# Patient Record
Sex: Male | Born: 1970
Health system: Southern US, Community
[De-identification: ages and names within clinical notes are randomized; demographics above are authoritative.]

## PROBLEM LIST (undated history)

## (undated) DIAGNOSIS — E7800 Pure hypercholesterolemia, unspecified: Secondary | ICD-10-CM

## (undated) DIAGNOSIS — J309 Allergic rhinitis, unspecified: Secondary | ICD-10-CM

## (undated) DIAGNOSIS — F4310 Post-traumatic stress disorder, unspecified: Secondary | ICD-10-CM

## (undated) DIAGNOSIS — G4733 Obstructive sleep apnea (adult) (pediatric): Secondary | ICD-10-CM

## (undated) DIAGNOSIS — F329 Major depressive disorder, single episode, unspecified: Secondary | ICD-10-CM

## (undated) HISTORY — DX: Obstructive sleep apnea (adult) (pediatric): G47.33

## (undated) HISTORY — DX: Pure hypercholesterolemia, unspecified: E78.00

## (undated) HISTORY — DX: Major depressive disorder, single episode, unspecified: F32.9

## (undated) HISTORY — DX: Allergic rhinitis, unspecified: J30.9

## (undated) HISTORY — DX: Post-traumatic stress disorder, unspecified: F43.10

---

## 2014-02-28 ENCOUNTER — Ambulatory Visit (INDEPENDENT_AMBULATORY_CARE_PROVIDER_SITE_OTHER): Payer: Managed Care, Other (non HMO) | Admitting: Emergency Medicine

## 2014-02-28 VITALS — BP 110/70 | HR 60 | Temp 99.2°F | Resp 16 | Ht 67.25 in | Wt 177.0 lb

## 2014-02-28 DIAGNOSIS — E785 Hyperlipidemia, unspecified: Secondary | ICD-10-CM

## 2014-02-28 DIAGNOSIS — Z0000 Encounter for general adult medical examination without abnormal findings: Secondary | ICD-10-CM

## 2014-02-28 DIAGNOSIS — Z23 Encounter for immunization: Secondary | ICD-10-CM

## 2014-02-28 DIAGNOSIS — E7800 Pure hypercholesterolemia, unspecified: Secondary | ICD-10-CM

## 2014-02-28 DIAGNOSIS — Z1159 Encounter for screening for other viral diseases: Secondary | ICD-10-CM

## 2014-02-28 DIAGNOSIS — Z113 Encounter for screening for infections with a predominantly sexual mode of transmission: Secondary | ICD-10-CM

## 2014-02-28 HISTORY — DX: Hyperlipidemia, unspecified: E78.5

## 2014-02-28 LAB — POCT CBC
Granulocyte percent: 46.1 %G (ref 37–80)
HCT, POC: 45.8 % (ref 43.5–53.7)
Hemoglobin: 15.1 g/dL (ref 14.1–18.1)
Lymph, poc: 2.6 (ref 0.6–3.4)
MCH: 29.8 pg (ref 27–31.2)
MCHC: 33 g/dL (ref 31.8–35.4)
MCV: 90.3 fL (ref 80–97)
MID (CBC): 0.2 (ref 0–0.9)
MPV: 7.3 fL (ref 0–99.8)
POC GRANULOCYTE: 2.4 (ref 2–6.9)
POC LYMPH %: 50.4 % — AB (ref 10–50)
POC MID %: 3.5 %M (ref 0–12)
Platelet Count, POC: 224 10*3/uL (ref 142–424)
RBC: 5.07 M/uL (ref 4.69–6.13)
RDW, POC: 12.5 %
WBC: 5.1 10*3/uL (ref 4.6–10.2)

## 2014-02-28 LAB — POCT URINALYSIS DIPSTICK
BILIRUBIN UA: NEGATIVE
Glucose, UA: NEGATIVE
KETONES UA: NEGATIVE
LEUKOCYTES UA: NEGATIVE
Nitrite, UA: NEGATIVE
Protein, UA: NEGATIVE
RBC UA: NEGATIVE
SPEC GRAV UA: 1.02
UROBILINOGEN UA: 0.2
pH, UA: 5.5

## 2014-02-28 LAB — LIPID PANEL
CHOL/HDL RATIO: 7.3 ratio
Cholesterol: 307 mg/dL — ABNORMAL HIGH (ref 0–200)
HDL: 42 mg/dL (ref >39)
LDL CALC: 250 mg/dL — AB (ref 0–99)
Triglycerides: 74 mg/dL (ref <150)
VLDL: 15 mg/dL (ref 0–40)

## 2014-02-28 LAB — HIV ANTIBODY (ROUTINE TESTING W REFLEX): HIV: NONREACTIVE

## 2014-02-28 LAB — HEPATITIS C ANTIBODY: HCV Ab: NEGATIVE

## 2014-02-28 LAB — POCT UA - MICROSCOPIC ONLY
Bacteria, U Microscopic: NEGATIVE
Casts, Ur, LPF, POC: NEGATIVE
Crystals, Ur, HPF, POC: NEGATIVE
Epithelial cells, urine per micros: 0.3
MUCUS UA: NEGATIVE
RBC, URINE, MICROSCOPIC: NEGATIVE
WBC, Ur, HPF, POC: NEGATIVE
Yeast, UA: NEGATIVE

## 2014-02-28 LAB — COMPLETE METABOLIC PANEL WITH GFR
ALT: 22 U/L (ref 0–53)
AST: 20 U/L (ref 0–37)
Albumin: 4.1 g/dL (ref 3.5–5.2)
Alkaline Phosphatase: 44 U/L (ref 39–117)
BUN: 16 mg/dL (ref 6–23)
CALCIUM: 9.5 mg/dL (ref 8.4–10.5)
CHLORIDE: 104 meq/L (ref 96–112)
CO2: 29 mEq/L (ref 19–32)
Creat: 1.1 mg/dL (ref 0.50–1.35)
GFR, Est African American: >89 mL/min
GFR, Est Non African American: 81 mL/min
Glucose, Bld: 78 mg/dL (ref 70–99)
Potassium: 4.1 mEq/L (ref 3.5–5.3)
Sodium: 139 mEq/L (ref 135–145)
Total Bilirubin: 0.8 mg/dL (ref 0.2–1.2)
Total Protein: 7.1 g/dL (ref 6.0–8.3)

## 2014-02-28 MED ORDER — ATORVASTATIN CALCIUM 10 MG PO TABS: 10.0000 mg | ORAL_TABLET | Freq: Every day | ORAL | Status: DC

## 2014-02-28 NOTE — Progress Notes (Signed)
This chart was scribed for Spencer Jordan, MD by Einar Pheasant, ED Scribe. This patient was seen in room 9 and the patient's care was started at 11:26 AM.  Subjective:    Patient ID: Spencer Byrd Plan, male    DOB: 25-Jul-1970, 44 y.o.   MRN: [id number]  Chief Complaint  Patient presents with  . Annual Exam    HPI Spencer Byrd is a 44 y.o. male with a PMhx of seasonal allergies and high cholesterol.  Today, pt presents to the office for his annual Exam. Pt works at [employer]. Denies getting a flu shot this season. He also denies ever getting a tetanus vaccine in the last 10 years. Pt states that he exercises regularly by lifting weights. He denies any illicit drug use, tobacco use, and alcohol use. Pt was incarcerated for 8 years.   No pertinent family hx.  Pt reports eating a bowl of cereal this morning at 6:30 AM.  There are no active problems to display for this patient.  Past Medical History  Diagnosis Date  . High cholesterol    History reviewed. No pertinent past surgical history. No Known Allergies Prior to Admission medications   Medication Sig Start Date End Date Taking? Authorizing Provider  atorvastatin (LIPITOR) 10 MG tablet Take 10 mg by mouth daily.   Yes Historical Provider, MD   History   Social History  . Marital Status: Married    Spouse Name: N/A    Number of Children: N/A  . Years of Education: N/A   Occupational History  . Not on file.   Social History Main Topics  . Smoking status: Never Smoker   . Smokeless tobacco: Not on file  . Alcohol Use: Not on file  . Drug Use: Not on file  . Sexual Activity: Not on file   Other Topics Concern  . Not on file   Social History Narrative  . No narrative on file    Review of Systems  Constitutional: Negative for fatigue and unexpected weight change.  Eyes: Negative for visual disturbance.  Respiratory: Negative for cough, chest tightness and shortness of breath.     Cardiovascular: Negative for chest pain, palpitations and leg swelling.  Gastrointestinal: Negative for abdominal pain and blood in stool.  Neurological: Negative for dizziness, light-headedness and headaches.       Objective:   Physical Exam  Constitutional: He appears well-developed and well-nourished. No distress.  HENT:  Head: Normocephalic and atraumatic.  Eyes: Conjunctivae are normal. Right eye exhibits no discharge. Left eye exhibits no discharge.  Neck: Neck supple.  Cardiovascular: Normal rate, regular rhythm and normal heart sounds.  Exam reveals no gallop and no friction rub.   No murmur heard. Pulmonary/Chest: Effort normal and breath sounds normal. No respiratory distress.  Abdominal: Soft. He exhibits no distension. There is no tenderness.  Genitourinary: Rectum normal, prostate normal and penis normal. Guaiac negative stool. No penile tenderness.  Musculoskeletal: He exhibits no edema or tenderness.  Neurological: He is alert.  Skin: Skin is warm and dry.  Psychiatric: He has a normal mood and affect. His behavior is normal. Thought content normal.  Nursing note and vitals reviewed.    Filed Vitals:   02/28/14 1118  BP: 110/70  Pulse: 60  Temp: 99.2 F (37.3 C)  TempSrc: Oral  Resp: 16  Height: 5' 7.25" (1.708 m)  Weight: 177 lb (80.287 kg)  SpO2: 98%   Results for orders placed or performed in visit on  02/28/14  POCT CBC  Result Value Ref Range   WBC 5.1 4.6 - 10.2 K/uL   Lymph, poc 2.6 0.6 - 3.4   POC LYMPH PERCENT 50.4 (A) 10 - 50 %L   MID (cbc) 0.2 0 - 0.9   POC MID % 3.5 0 - 12 %M   POC Granulocyte 2.4 2 - 6.9   Granulocyte percent 46.1 37 - 80 %G   RBC 5.07 4.69 - 6.13 M/uL   Hemoglobin 15.1 14.1 - 18.1 g/dL   HCT, POC 45.8 43.5 - 53.7 %   MCV 90.3 80 - 97 fL   MCH, POC 29.8 27 - 31.2 pg   MCHC 33.0 31.8 - 35.4 g/dL   RDW, POC 12.5 %   Platelet Count, POC 224 142 - 424 K/uL   MPV 7.3 0 - 99.8 fL  POCT UA - Microscopic Only  Result  Value Ref Range   WBC, Ur, HPF, POC Neg    RBC, urine, microscopic Neg    Bacteria, U Microscopic Neg'    Mucus, UA Neg    Epithelial cells, urine per micros 0.3    Crystals, Ur, HPF, POC Neg    Casts, Ur, LPF, POC Neg    Yeast, UA neg   POCT urinalysis dipstick  Result Value Ref Range   Color, UA Yellow    Clarity, UA Clear    Glucose, UA Neg    Bilirubin, UA Neg    Ketones, UA Neg    Spec Grav, UA 1.020    Blood, UA Neg    pH, UA 5.5    Protein, UA Neg    Urobilinogen, UA 0.2    Nitrite, UA Neg    Leukocytes, UA Negative         Assessment & Plan:   Encouraged him to continue weight loss and exercise. His Lipitor was refilled. Routine labs were done. He was given a flu shot and DTaP today. I personally performed the services described in this documentation, which was scribed in my presence. The recorded information has been reviewed and is accurate.

## 2014-03-01 ENCOUNTER — Other Ambulatory Visit: Payer: Self-pay | Admitting: Radiology

## 2014-03-01 LAB — RPR

## 2014-03-01 MED ORDER — ATORVASTATIN CALCIUM 40 MG PO TABS: 40.0000 mg | ORAL_TABLET | Freq: Every day | ORAL | Status: DC

## 2014-03-02 ENCOUNTER — Telehealth: Payer: Self-pay | Admitting: *Deleted

## 2014-03-02 LAB — GC/CHLAMYDIA PROBE AMP
CT PROBE, AMP APTIMA: NEGATIVE
GC Probe RNA: NEGATIVE

## 2014-03-02 NOTE — Telephone Encounter (Signed)
Dr Everlene Farrier- please be advised that the patient came to the office today stating that he is concerned about increasing the lipitor to 40mg . He states that he has not been taking the lipitor for the past few months at least as he did not have any. He also was not fasting at his visit when the blood work was completed. He picked up the supply of the Lipitor 10mg  at the pharmacy and has restarted taking this.  Pt needs to know if he should still increase to 40mg . Please call [phone]

## 2014-03-03 ENCOUNTER — Other Ambulatory Visit: Payer: Self-pay

## 2014-03-03 MED ORDER — ATORVASTATIN CALCIUM 40 MG PO TABS: 40.0000 mg | ORAL_TABLET | Freq: Every day | ORAL | Status: DC

## 2014-03-03 NOTE — Telephone Encounter (Signed)
Spoke to pt- he understands to take medication daily and will pick up the 40mg . Resent script to pharmacy as they did not have it. Only received the 10mg  rx.

## 2014-03-03 NOTE — Telephone Encounter (Signed)
His cholesterol level was over 300. There is no way 10 mg of Lipitor will control this. He needs to take the 40 mg dose

## 2014-03-07 ENCOUNTER — Telehealth: Payer: Self-pay

## 2014-03-07 NOTE — Telephone Encounter (Signed)
The patient called about a medication reaction to Atorvastatin.  He said that the increased dosage makes him itch all over his body.  He said that he used to take a smaller dosage, but at his last visit, his dosage was increased.  He said he did not have a reaction when he took the lesser dosage.  Please advise.  Thank you.  CB#: [phone]

## 2014-03-07 NOTE — Telephone Encounter (Signed)
Can Lipitor cause hives?

## 2014-03-08 NOTE — Telephone Encounter (Signed)
I would have the patient stop the medication and RTC for [place] to evaluate the rash.  It is definitely possible that he is having an allergic reaction to the higher dose but I would not him on the lower dose of that medication if he is truly having  Reaction.

## 2014-03-09 NOTE — Telephone Encounter (Signed)
Pt has continued taking medication. He is no longer having any kind of reaction. He states his wife bought a new body wash for him and this was the cause. He has stopped using the soap.   Advised pt to call us with any concerns or RTC

## 2014-04-14 ENCOUNTER — Telehealth: Payer: Self-pay

## 2014-04-14 NOTE — Telephone Encounter (Signed)
DAUB - Pt saw you in  January for his CPE.  He now needs a physical form filled out for his work.  I have left this in the nurses box.  Please fax to his employer at [phone] once completed and call him at [phone] to let him know its been done.

## 2014-04-15 NOTE — Telephone Encounter (Signed)
Dr. Everlene Farrier, form filled out and placed in your box.

## 2014-04-23 ENCOUNTER — Telehealth: Payer: Self-pay

## 2014-04-23 NOTE — Telephone Encounter (Signed)
I believe we faxed this in.

## 2014-04-23 NOTE — Telephone Encounter (Signed)
Patient has not heard anything from [place] about his form he left on February 23.  See previous phone message.  The form was filled out and left in Daub's box.  I looked for it and did not see it.  Please call [phone]

## 2014-11-21 ENCOUNTER — Ambulatory Visit (INDEPENDENT_AMBULATORY_CARE_PROVIDER_SITE_OTHER): Payer: Managed Care, Other (non HMO) | Admitting: Physician Assistant

## 2014-11-21 VITALS — BP 114/80 | HR 55 | Temp 97.7°F | Resp 18 | Ht 67.5 in | Wt 171.0 lb

## 2014-11-21 DIAGNOSIS — E785 Hyperlipidemia, unspecified: Secondary | ICD-10-CM | POA: Diagnosis not present

## 2014-11-21 DIAGNOSIS — Z7189 Other specified counseling: Secondary | ICD-10-CM | POA: Diagnosis not present

## 2014-11-21 NOTE — Addendum Note (Signed)
Addended by: Tereasa Coop on: 11/21/2014 09:52 AM   Modules accepted: Orders, Medications, SmartSet

## 2014-11-21 NOTE — Progress Notes (Signed)
   11/21/2014 at 9:47 AM  [name] Plan / DOB: 08-May-1970 / MRN: [id number]  The patient has Hyperlipidemia on his problem list.  SUBJECTIVE  Spencer Byrd is a 44 y.o. well appearing male presenting for the chief complaint of cholesterol check. He was placed on cholesterol medicine last year and would like his level checked today.    He denies a family history of heart disease. He has no history of HTN, DM2, and does not smoke. He is very physically active and tries to avoid sweets and greasy foods.      He  has a past medical history of High cholesterol.    Medications reviewed and updated by myself where necessary, and exist elsewhere in the encounter.   Mr. Spencer Byrd has No Known Allergies. He  reports that he has never smoked. He does not have any smokeless tobacco history on file. He  has no sexual activity history on file. The patient  has no past surgical history on file.  His family history is not on file.  Review of Systems  Constitutional: Negative.   Genitourinary: Negative.     OBJECTIVE  His  height is 5' 7.5" (1.715 m) and weight is 171 lb (77.565 kg). His oral temperature is 97.7 F (36.5 C). His blood pressure is 114/80 and his pulse is 55. His respiration is 18 and oxygen saturation is 99%.  The patient's body mass index is 26.37 kg/(m^2).  Physical Exam  Constitutional: He is oriented to person, place, and time.  Cardiovascular: Normal rate.   Respiratory: Effort normal.  Neurological: He is alert and oriented to person, place, and time. No cranial nerve deficit.  Skin: Skin is warm and dry.  Psychiatric: He has a normal mood and affect.    Lab Results  Component Value Date   CHOL 307* 02/28/2014   Lab Results  Component Value Date   HDL 42 02/28/2014   Lab Results  Component Value Date   LDLCALC 250* 02/28/2014   Lab Results  Component Value Date   TRIG 74 02/28/2014   Lab Results  Component Value Date   CHOLHDL 7.3 02/28/2014   No  results found for: LDLDIRECT   No results found for this or any previous visit (from the past 24 hour(s)).  ASSESSMENT & PLAN  Crist was seen today for hyperlipidemia.  Diagnoses and all orders for this visit:  Encounter for medication counseling: ASCVD risk calculated using pre cholesterol medication lipid panel.  10 year global risk at 4%. Given the lack of other risk factors he will not benefit from a statin medication and may suffer side effects.  He is to return to clinic for an annual physical.    The patient was advised to call or come back to clinic if he does not see an improvement in symptoms, or worsens with the above plan.   Philis Fendt, MHS, PA-C [facility] 11/21/2014 9:47 AM

## 2016-11-20 ENCOUNTER — Encounter: Payer: Self-pay | Admitting: Psychology

## 2017-04-03 ENCOUNTER — Encounter: Payer: Self-pay | Admitting: Psychology

## 2017-04-03 ENCOUNTER — Ambulatory Visit: Payer: 59 | Admitting: Psychology

## 2017-04-03 DIAGNOSIS — R413 Other amnesia: Secondary | ICD-10-CM | POA: Diagnosis not present

## 2017-04-03 NOTE — Progress Notes (Signed)
NEUROBEHAVIORAL STATUS EXAM   Name: Spencer Byrd Date of Birth: [birth year] Date of Interview: 04/03/2017  Reason for Referral:  Spencer Byrd is a 47 y.o. male who is referred for neuropsychological evaluation by Cleda Mccre[facility] of [facility] due to concerns about memory decline. This patient is unaccompanied in the office for today's visit.  History of Presenting Problem:  Spencer Byrd reports memory difficulties over at least the past year or so, gradual in onset. It is concerning to him and particularly concerning to his wife, who wants to get him evaluated to make sure he is not developing dementia. He works full time, third shift, at [employer]. He does not have any difficulties performing work responsibilities, but he notes the work is not too cognitively demanding. He does have to remember numbers and he doesn't have trouble with that. MMSE was 28/30 at last appointment with Spencer Byrd in 10/2016.  Upon direct questioning, the patient reported:   Forgetting recent conversations/events: yes, but cues help Repeating statements/questions: yes (wife says he does) Misplacing/losing items: sometimes, but finds eventually Forgetting appointments or other obligations: could forget if he doesn't have a reminder/alert Forgetting to take medications: could occur if doesn't have a reminder/alert  Difficulty concentrating: yes Starting but not finishing tasks: yes  Distracted easily: yes  Word-finding difficulty: yes Comprehension difficulty: "only if people use big words"  Getting lost when driving: No Uncertain about directions when driving or passenger: Somewhat, but he has only been here for 3 years (moved here from [place])  There is no known family history of dementia.  He denies any prior psychiatric history. He reports he is typically a happy person, quiet and respectful. He reports significant stress lately due to his mother's illness -  she has cancer and is currently in the hospital. He lost his stepfather to cancer a year ago. He is very close with his mother.  He denies any sleep difficulty. He falls asleep very easily and without meaning to when he is home with the family. He figured this is just due to the large amount of physical labor he engages in daily at work. Per medical records, he had a sleep study in 2017 which showed only mild OSA not requiring CPAP treatment. Epworth scale was 19 at his last PCP appointment in 10/2016.  He reports he used to work out an hour a day but he has been "getting lazy" the past several months. He is trying to get back into it. He reports he got bored with it. He has a lifelong history of getting bored with things quickly.   Appetite is good. His wife tells him he is eating more.   Social History: Born/Raised: [place] Education: Didn't care much about school. Feels like he just got passed along in school. He left school in high school. He is taking a GED class, he is somewhat ashamed of not finishing high school. Occupational history: currently employed at [employer] Marital history: Married x17 years, 3 children (youngest daughter lives here), 1 granddaughter  Alcohol: Minimal, once in a while Tobacco: Never a smoker SA: None   Medical History: Past Medical History:  Diagnosis Date  . High cholesterol       Current Medications:  Flonase 50 mcg 1 spray in each nostril daily Atorvastatin Calcium 20 mg 1 tablet daily   Behavioral Observations:   Appearance: Neatly and appropriately dressed and groomed Gait: Ambulated independently, no gross abnormalities observed  Speech: Fluent; normal rate, rhythm and volume. No significant word finding difficulty. Thought process: Linear, goal directed Affect: Full, euthymic Interpersonal: Very pleasant, appropriate   40 minutes spent face-to-face with patient completing neurobehavioral status exam. 20 minutes spent  integrating medical records/clinical data and completing this report. CPT code [phone].   TESTING: There is medical necessity to proceed with neuropsychological assessment as the results will be used to aid in differential diagnosis and clinical decision-making and to inform specific treatment recommendations. Per the patient and medical records reviewed, there has been a change in cognitive functioning and a reasonable suspicion of neurocognitive disorder (possibly related to sleep disturbance, possibly related to psychosocial stress).  Clinical Decision Making: In considering the patient's current level of functioning, level of presumed impairment, nature of symptoms, emotional and behavioral responses during the interview, level of literacy, and observed level of motivation, a battery of tests was selected and communicated to the psychometrician.    PLAN: The patient will return tomorrow to complete the above referenced full battery of neuropsychological testing with a psychometrician under my supervision. Education regarding testing procedures was provided to the patient. Subsequently, the patient will see me on 04/12/2017 for a follow-up session at which time his test performances and my impressions and treatment recommendations will be reviewed in detail.  Evaluation ongoing; full report to follow.

## 2017-04-04 ENCOUNTER — Ambulatory Visit: Payer: 59 | Admitting: Psychology

## 2017-04-04 DIAGNOSIS — R413 Other amnesia: Secondary | ICD-10-CM

## 2017-04-04 NOTE — Progress Notes (Signed)
   Neuropsychology Note  Sherrel Printice Hellmer completed 90 minutes of neuropsychological testing with technician, Milana Kidney, BS, under the supervision of Dr. Macarthur Critchley, Licensed Psychologist. The patient did not appear overtly distressed by the testing session, per behavioral observation or via self-report to the technician. Rest breaks were offered.   Clinical Decision Making: In considering the patient's current level of functioning, level of presumed impairment, nature of symptoms, emotional and behavioral responses during the interview, level of literacy, and observed level of motivation/effort, a battery of tests was selected and communicated to the psychometrician.  Communication between the psychologist and technician was ongoing throughout the testing session and changes were made as deemed necessary based on patient performance on testing, technician observations and additional pertinent factors such as those listed above.  Spencer Byrd will return within approximately 2 weeks for an interactive feedback session with Dr. Si Raider at which time his test performances, clinical impressions and treatment recommendations will be reviewed in detail. The patient understands he can contact our office should he require our assistance before this time.  25 minutes spent performing neuropsychological evaluation services/clinical decision making (psychologist). [CPT 51898] 42 minutes spent face-to-face with patient administering standardized tests, 30 minutes spent scoring (technician). [CPT [id number], 10312]  Full report to follow.

## 2017-04-10 NOTE — Progress Notes (Signed)
NEUROPSYCHOLOGICAL EVALUATION   Name:    Spencer Byrd  Date of Birth:   [birth year] Date of Interview:  04/03/2017 Date of Testing:  04/04/2017   Date of Feedback:  04/12/2017       Background Information:  Reason for Referral:  Spencer Byrd is a 47 y.o. male referred by Barth Kirks Redmon PA-C of [facility] to assess his current level of cognitive functioning and assist in differential diagnosis. The current evaluation consisted of a review of available medical records, an interview with the patient, and the completion of a neuropsychological testing battery. Informed consent was obtained.  History of Presenting Problem:  Spencer Byrd reports memory difficulties over at least the past year or so, gradual in onset. It is concerning to him and particularly concerning to his wife, who wants to get him evaluated to make sure he is not developing dementia. He works full time, third shift, at [employer]. He does not have any difficulties performing work responsibilities, but he notes the work is not too cognitively demanding. He does have to remember numbers and he doesn't have trouble with that. MMSE was 28/30 at last appointment with Noelle Redmon in 10/2016.  Upon direct questioning, the patient reported:   Forgetting recent conversations/events: yes, but cues help Repeating statements/questions: yes (wife says he does) Misplacing/losing items: sometimes, but finds eventually Forgetting appointments or other obligations: could forget if he doesn't have a reminder/alert Forgetting to take medications: could occur if doesn't have a reminder/alert  Difficulty concentrating: yes Starting but not finishing tasks: yes  Distracted easily: yes  Word-finding difficulty: yes Comprehension difficulty: "only if people use big words"  Getting lost when driving: No Uncertain about directions when driving or passenger: Somewhat, but he has only been here for 3 years  (moved here from [place])  There is no known family history of dementia.  He denies any prior psychiatric history. He reports he is typically a happy person, quiet and respectful. He reports significant stress lately due to his mother's illness - she has cancer and is currently in the hospital. He lost his stepfather to cancer a year ago. He is very close with his mother. It should also be noted that the patient was under chronic stress for 9 years while he was incarcerated. He has been out for 3 years, and continues to demonstrate signs of hypervigilance, increased startle response, etc., related to his prison experience.  He denies any sleep difficulty. He falls asleep very easily and without meaning to when he is home with the family. He figured this is just due to the large amount of physical labor he engages in daily at work. Per medical records, he had a sleep study in 2017 which showed only mild OSA not requiring CPAP treatment. Epworth scale was 19 at his last PCP appointment in 10/2016.  He reports he used to work out an hour a day but he has been "getting lazy" the past several months. He is trying to get back into it. He reports he got bored with it. He has a lifelong history of getting bored with things quickly.   Appetite is good. His wife tells him he is eating more.   Social History: Born/Raised: [place] Education: Didn't care much about school. Feels like he just got passed along in school. He left school in high school. He is taking a GED class, he is somewhat ashamed of not finishing high school. Occupational history: currently employed at [employer] Marital history: Married x17 years, 3 children (youngest daughter lives here), 1 granddaughter  Alcohol: Minimal, once in a while Tobacco: Never a smoker SA: None   Medical History:  Past Medical History:  Diagnosis Date  . High cholesterol     Current medications:  Flonase 50 mcg 1 spray in each nostril  daily Atorvastatin Calcium 20 mg 1 tablet daily  Current Examination:  Behavioral Observations:  Appearance: Neatly and appropriately dressed and groomed Gait: Ambulated independently, no gross abnormalities observed Speech: Fluent; normal rate, rhythm and volume. No significant word finding difficulty. Thought process: Linear, goal directed Affect: Full, euthymic Interpersonal: Very pleasant, appropriate Orientation: Oriented to all spheres. Accurately named the current President and his predecessor.   Tests Administered: . Test of Premorbid Functioning (TOPF) . Wechsler Adult Intelligence Scale-Fourth Edition (WAIS-IV): Similarities, Block Design, Matrix Reasoning, Information, Arithmetic, Symbol Search, Coding and Digit Span subtests . Wechsler Memory Scale-Fourth Edition (WMS-IV) Adult Version (ages 51-69): Logical Memory I, II and Recognition subtests  . [employer] Verbal Learning Test - 2nd Edition (CVLT-2) Short Form . Repeatable Battery for the Assessment of Neuropsychological Status (RBANS) Form A:  Figure Copy and Recall subtests  . Neuropsychological Assessment Battery (NAB) Language Module, Form 1: Naming subtest . Boston Diagnostic Aphasia Examination: Commands subtest . Controlled Oral Word Association Test (COWAT) . Trail Making Test A and B . Clock drawing test . Beck Depression Inventory - 2nd Edition (BDI-II) . Generalized Anxiety Disorder - 7 item screener (GAD-7)  Test Results: Note: Standardized scores are presented only for use by appropriately trained professionals and to allow for any future test-retest comparison. These scores should not be interpreted without consideration of all the information that is contained in the rest of the report. The most recent standardization samples from the test publisher or other sources were used whenever possible to derive standard scores; scores were corrected for age, gender, ethnicity and education when available.   Test  Scores:  Test Name Raw Score Standardized Score Descriptor  TOPF 12/70 SS= 70 Borderline  WAIS-IV Subtests     Similarities 15/36 ss= 5 Borderline  Block Design 24/66 ss= 6 Low average  Matrix Reasoning 9/26 ss= 6 Low average  Information 4/26 ss= 4 Impaired  Arithmetic 9/22 ss= 6 Low average  Symbol Search 26/60 ss= 8 Low end of average  Coding 54/135 ss= 8 Low end of average  Digit Span  17/48 ss= 5 Borderline  WAIS Index Scores     Verbal Comprehension  SS= 70 Borderline  Perceptual Reasoning  SS= 77 Borderline  Working Memory  SS= 74 Borderline  Processing Speed  SS= 89 Low average  Full Scale IQ (8 subtests)  SS= 73 Borderline  WMS-IV Subtests     LM I 12/50 ss= 4 Impaired  LM II 14/50 ss= 7 Low average  LM II Recognition 22/30 Cum %: 17-25   RBANS Subtests     Figure Copy 19/20 Z= 0.5 Average  Figure Recall 12/20 Z= -0.5 Average  CVLT-II Scores     Trial 1 5/9 Z= -1 Low average  Trial 4 9/9 Z= 1 High average  Trials 1-4 total 28/36 T= 53 Average  SD Free Recall 9/9 Z= 2 Very superior  LD Free Recall 8/9 Z= 1 High average  LD Cued Recall 8/9 Z= 1 High average  Recognition Discriminability 9/9 hits, 0 false positives Z= 1 High average  Forced Choice Recognition 9/9  WNL  NAB Naming 28/31 T= 37 Low average  BDAE  Commands 15/15  WNL  COWAT-FAS 24 T= 35 Borderline  COWAT-Animals 20 T= 50 Average  Trail Making Test A  31" 1 error T= 50 Average  Trail Making Test B  45" 0 errors T= 61 High average  BDI-II 14/63  Mild  GAD-7 16/21  Severe      Description of Test Results:  Embedded performance validity indicators were within normal limits. As such, the patient's current performance on neurocognitive testing is judged to be a relatively accurate representation of his current level of neurocognitive functioning.   Premorbid verbal intellectual abilities were estimated to have been within the borderline range based on a test of word reading. Current Full Scale IQ  fell within the borderline range as well.   Psychomotor processing speed was low average. Auditory attention and working memory were borderline. Visual-spatial construction was low average to average. Language abilities were within normal limits. Specifically, confrontation naming was low average, and semantic verbal fluency was average. Auditory comprehension of multi-step commands was intact. With regard to verbal memory, encoding and acquisition of non-contextual information (i.e., word list) was average. After a brief distracter task, free recall was very superior (9/9 items recalled). After a delay, free recall was high average (8/9 items). Cued recall was high average (8/9 items). Performance on a yes/no recognition task was high average with 100% accuracy. On another verbal memory test, encoding and acquisition of contextual auditory information (i.e., short stories) was impaired. After a delay, free recall was low average, with good retention of previously encoded information. Performance on a yes/no recognition task was within normal limits. With regard to non-verbal memory, delayed free recall of visual information was average. Executive functioning was within expectation. Mental flexibility and set-shifting were high average on Trails B. Verbal fluency with phonemic search restrictions was borderline. Verbal abstract reasoning was borderline. Non-verbal abstract reasoning was low average.   On a self-report measure of mood, the patient's responses were indicative of mild depression at the present time. Symptoms endorsed included: pessimism, anhedonia, guilty feelings, punishment feelings, loss of self confidence, self criticalness, less interest, feelings of worthlessness, loss of energy, concentration difficulty, fatigue and reduced libido. He denied suicidal ideation or intention. On a self-report measure of anxiety, the patient endorsed clinically significant, severe generalized anxiety at the  present time, characterized by nervousness, excessive worries, inability to control worrying, difficulty relaxing, irritability, and fear of something awful happening.    Clinical Impressions: No dementia/cognitive decline. Significant anxiety related to psychosocial stressors. Results of cognitive testing were commensurate with estimated baseline abilities and indicated strong memory function. There is no indication of dementia, memory disorder or other cognitive disorder from this evaluation. The patient reports problems with attention/concentration in the context of significant psychosocial stressors (ie mother's cancer with recent hospitalization). I also believe he has some symptoms of posttraumatic stress (eg hypervigilance, hyperarousal) related to history of 9-year incarceration. I suspect it is stress/anxiety that is affecting his cognitive functioning in daily life. He is also reporting significant daytime sleepiness and has been diagnosed with mild sleep apnea in the past. If this has progressed to more moderate sleep apnea, it could be affecting cognitive function as well.    Recommendations/Plan: Based on the findings of the present evaluation, the following recommendations are offered:  1. SSRI such as Zoloft may be helpful for anxiety. Counseling could also be considered. The patient was reassured that there is no sign of dementia or cognitive disorder, and his inattention is likely a manifestation of stress/anxiety  disorder.  2. An updated sleep study given could be considered, given increase in daytime sleepiness and history of mild sleep apnea. However the patient reports he does not have the financial resources to complete another one of these.   Feedback to Patient: Spencer Byrd returned for a feedback appointment on 04/12/2017 to review the results of his neuropsychological evaluation with this provider. 30 minutes face-to-face time was spent reviewing his test results, my  impressions and my recommendations as detailed above.    Total time spent on this patient's case: 60 minutes for neurobehavioral status exam with psychologist (CPT code [phone]); 120 minutes of testing/scoring by psychometrician under psychologist's supervision (CPT codes [phone], [phone] units); 200 minutes for integration of patient data, interpretation of standardized test results and clinical data, clinical decision making, treatment planning and preparation of this report, and interactive feedback with review of results to the patient/family by psychologist (CPT codes [phone], [phone] units).      Thank you for your referral of Carl. Please feel free to contact me if you have any questions or concerns regarding this report.

## 2017-04-11 ENCOUNTER — Ambulatory Visit (HOSPITAL_COMMUNITY)
Admission: EM | Admit: 2017-04-11 | Discharge: 2017-04-11 | Disposition: A | Discharge status: Home / Self Care | Payer: 59 | Attending: Family Medicine | Admitting: Family Medicine

## 2017-04-11 ENCOUNTER — Encounter (HOSPITAL_COMMUNITY): Payer: Self-pay | Admitting: Emergency Medicine

## 2017-04-11 ENCOUNTER — Other Ambulatory Visit: Payer: Self-pay

## 2017-04-11 DIAGNOSIS — B349 Viral infection, unspecified: Secondary | ICD-10-CM

## 2017-04-11 MED ORDER — BENZONATATE 100 MG PO CAPS: 100.0000 mg | ORAL_CAPSULE | Freq: Three times a day (TID) | ORAL | 0 refills | Status: DC

## 2017-04-11 MED ORDER — IPRATROPIUM BROMIDE 0.06 % NA SOLN: 2.0000 | Freq: Four times a day (QID) | NASAL | 0 refills | Status: DC

## 2017-04-11 MED ORDER — MELOXICAM 7.5 MG PO TABS: 7.5000 mg | ORAL_TABLET | Freq: Every day | ORAL | 0 refills | Status: DC

## 2017-04-11 NOTE — ED Provider Notes (Signed)
[facility] [facility]    CSN: [id number] Arrival date & time: 04/11/17  [place]     History   Chief Complaint Chief Complaint  Patient presents with  . Fever    HPI Spencer Byrd is a 47 y.o. male.   47 year old male comes in for 4-day history of URI symptoms.  Has had nonproductive cough, nasal congestion, rhinorrhea, sore throat.  Frontal headache is worse with cough.  Denies fever, but has had alternating hot and cold chills.  OTC Mucinex, hot tea with little relief.  No sick contact.  Never smoker.      Past Medical History:  Diagnosis Date  . High cholesterol     Patient Active Problem List   Diagnosis Date Noted  . Hyperlipidemia 02/28/2014    History reviewed. No pertinent surgical history.     Home Medications    Prior to Admission medications   Medication Sig Start Date End Date Taking? Authorizing Provider  atorvastatin (LIPITOR) 40 MG tablet Take 40 mg by mouth daily. 02/02/17  Yes [provider]  fluticasone Asencion Islam) 50 MCG/ACT nasal spray  04/02/17  Yes [provider]  benzonatate (TESSALON) 100 MG capsule Take 1 capsule (100 mg total) by mouth every 8 (eight) hours. 04/11/17   Tasia Catchings, Davanna He V, PA-C  ipratropium (ATROVENT) 0.06 % nasal spray Place 2 sprays into both nostrils 4 (four) times daily. 04/11/17   Tasia Catchings, Shana Zavaleta V, PA-C  meloxicam (MOBIC) 7.5 MG tablet Take 1 tablet (7.5 mg total) by mouth daily. 04/11/17   Ok Edwards, PA-C    Family History Family History  Problem Relation Age of Onset  . Cancer Mother     Social History Social History   Tobacco Use  . Smoking status: Never Smoker  . Smokeless tobacco: Never Used  Substance Use Topics  . Alcohol use: No    Alcohol/week: 0.0 oz    Frequency: Never    Comment: minimal/rare  . Drug use: No     Allergies   Patient has no known allergies.   Review of Systems Review of Systems  Reason unable to perform ROS: See HPI as above.     Physical Exam Triage Vital  Signs ED Triage Vitals  Enc Vitals Group     BP 04/11/17 1926 134/79     Pulse Rate 04/11/17 1926 67     Resp 04/11/17 1926 18     Temp 04/11/17 1926 98.1 F (36.7 C)     Temp Source 04/11/17 1926 Oral     SpO2 04/11/17 1926 98 %     Weight --      Height --      Head Circumference --      Peak Flow --      Pain Score 04/11/17 1923 8     Pain Loc --      Pain Edu? --      Excl. in [place]? --    No data found.  Updated Vital Signs BP 134/79 (BP Location: Left Arm)   Pulse 67   Temp 98.1 F (36.7 C) (Oral)   Resp 18   SpO2 98%   Physical Exam  Constitutional: He is oriented to person, place, and time. He appears well-developed and well-nourished. No distress.  HENT:  Head: Normocephalic and atraumatic.  Right Ear: External ear and ear canal normal. Tympanic membrane is erythematous. Tympanic membrane is not bulging.  Left Ear: External ear and ear canal normal. Tympanic membrane is erythematous. Tympanic membrane  is not bulging.  Nose: Mucosal edema and rhinorrhea present. Right sinus exhibits no maxillary sinus tenderness and no frontal sinus tenderness. Left sinus exhibits no maxillary sinus tenderness and no frontal sinus tenderness.  Mouth/Throat: Uvula is midline and mucous membranes are normal. Posterior oropharyngeal erythema present. No tonsillar exudate.  Eyes: Conjunctivae are normal. Pupils are equal, round, and reactive to light.  Neck: Normal range of motion. Neck supple.  Cardiovascular: Normal rate, regular rhythm and normal heart sounds. Exam reveals no gallop and no friction rub.  No murmur heard. Pulmonary/Chest: Effort normal and breath sounds normal. He has no decreased breath sounds. He has no wheezes. He has no rhonchi. He has no rales.  Lymphadenopathy:    He has no cervical adenopathy.  Neurological: He is alert and oriented to person, place, and time.  Skin: Skin is warm and dry.  Psychiatric: He has a normal mood and affect. His behavior is normal.  Judgment normal.     UC Treatments / Results  Labs (all labs ordered are listed, but only abnormal results are displayed) Labs Reviewed - No data to display  EKG  EKG Interpretation None       Radiology No results found.  Procedures Procedures (including critical care time)  Medications Ordered in UC Medications - No data to display   Initial Impression / Assessment and Plan / UC Course  I have reviewed the triage vital signs and the nursing notes.  Pertinent labs & imaging results that were available during my care of the patient were reviewed by me and considered in my medical decision making (see chart for details).    Discussed with patient history and exam most consistent with viral URI. Symptomatic treatment as needed. Push fluids. Return precautions given.    Final Clinical Impressions(s) / UC Diagnoses   Final diagnoses:  Viral illness    ED Discharge Orders        Ordered    ipratropium (ATROVENT) 0.06 % nasal spray  4 times daily     04/11/17 1952    benzonatate (TESSALON) 100 MG capsule  Every 8 hours     04/11/17 1952    meloxicam (MOBIC) 7.5 MG tablet  Daily     04/11/17 1957       Ok Edwards, [place] 04/11/17 [phone]

## 2017-04-11 NOTE — ED Triage Notes (Signed)
Onset Sunday of feeling bad.  Symptoms include: hoarseness, hot and cold chills, sore throat, unknown if any fever

## 2017-04-11 NOTE — Discharge Instructions (Signed)
Tessalon for cough. Continue  flonase. Start atrovent nasal spray, zyrtec-D for nasal congestion/drainage. You can use over the counter nasal saline rinse such as neti pot for nasal congestion. Keep hydrated, your urine should be clear to pale yellow in color. Tylenol/motrin for fever and pain. Monitor for any worsening of symptoms, chest pain, shortness of breath, wheezing, swelling of the throat, follow up for reevaluation.   For sore throat try using a honey-based tea. Use 3 teaspoons of honey with juice squeezed from half lemon. Place shaved pieces of ginger into 1/2-1 cup of water and warm over stove top. Then mix the ingredients and repeat every 4 hours as needed.

## 2017-04-12 ENCOUNTER — Encounter: Payer: Self-pay | Admitting: Psychology

## 2017-04-12 ENCOUNTER — Ambulatory Visit (INDEPENDENT_AMBULATORY_CARE_PROVIDER_SITE_OTHER): Payer: 59 | Admitting: Psychology

## 2017-04-12 DIAGNOSIS — R413 Other amnesia: Secondary | ICD-10-CM

## 2017-04-12 DIAGNOSIS — F411 Generalized anxiety disorder: Secondary | ICD-10-CM

## 2017-04-12 DIAGNOSIS — F419 Anxiety disorder, unspecified: Secondary | ICD-10-CM

## 2017-04-12 HISTORY — DX: Generalized anxiety disorder: F41.1

## 2017-04-12 NOTE — Patient Instructions (Signed)
There was no evidence of dementia, memory disorder or cognitive decline.   I suspect you are having some problems with attention due to increased stress.   I am recommending that your doctor talk to you about starting a medication for anxiety. Counseling may also be helpful.   Your doctor may also consider doing an updated sleep study since you are having such significant daytime sleepiness.

## 2017-06-11 DIAGNOSIS — E7800 Pure hypercholesterolemia, unspecified: Secondary | ICD-10-CM | POA: Diagnosis not present

## 2017-06-11 DIAGNOSIS — J309 Allergic rhinitis, unspecified: Secondary | ICD-10-CM | POA: Diagnosis not present

## 2017-06-11 DIAGNOSIS — Z0000 Encounter for general adult medical examination without abnormal findings: Secondary | ICD-10-CM | POA: Diagnosis not present

## 2017-06-19 DIAGNOSIS — Z0100 Encounter for examination of eyes and vision without abnormal findings: Secondary | ICD-10-CM | POA: Diagnosis not present

## 2017-07-25 ENCOUNTER — Telehealth: Payer: Self-pay | Admitting: Psychology

## 2017-07-25 NOTE — Telephone Encounter (Signed)
Patient wants to talk to you about his memory please call patient at [phone]

## 2017-07-26 NOTE — Telephone Encounter (Signed)
Spoke with patient via phone. He reported he is noticing even more forgetfulness since he underwent neuropsychological evaluation earlier this year. He reported he forgets things five minutes after he has been told them. No other concerning acute symptoms present. He is still working. When asked about stressors over the past few months, in shared that his mother passed away. He tells me his PCP put him on an antidepressant but it made him feel "too focused" and he was grinding his teeth and having clenched jaw. I do not know what medication he was prescribed, but I shared that some antidepressants like SSRIs can have that as a side effect initially as your body adjusts but it could go away in 1-2 weeks. I explained it takes several weeks for the drug to be at a therapeutic level so as long as he can tolerate the side effects he should restart it and give it 4-8 weeks to see if it benefits. I do suspect his continued memory complaints are secondary to stress/anxiety/depression, especially given that his mother passed away in the interim. If he has side effects that are not tolerable he should contact his PCP to see what he should do next. Because he denies any other acute neurologic symptoms he probably doesn't need neuroimaging at this time but his PCP may want to consider having this done if patient's anxiety/depression is adequately treated and he continues to have memory complaints.

## 2017-07-31 ENCOUNTER — Telehealth: Payer: Self-pay | Admitting: Psychology

## 2017-07-31 ENCOUNTER — Encounter: Payer: Self-pay | Admitting: Neurology

## 2017-07-31 NOTE — Telephone Encounter (Signed)
error 

## 2017-09-14 DIAGNOSIS — E7800 Pure hypercholesterolemia, unspecified: Secondary | ICD-10-CM | POA: Diagnosis not present

## 2017-10-09 ENCOUNTER — Ambulatory Visit: Payer: 59 | Admitting: Neurology

## 2017-10-09 ENCOUNTER — Encounter

## 2017-10-09 ENCOUNTER — Other Ambulatory Visit: Payer: Self-pay

## 2017-10-09 ENCOUNTER — Other Ambulatory Visit: Payer: 59

## 2017-10-09 ENCOUNTER — Encounter: Payer: Self-pay | Admitting: Neurology

## 2017-10-09 VITALS — BP 114/82 | HR 70 | Ht 67.0 in | Wt 169.0 lb

## 2017-10-09 DIAGNOSIS — F419 Anxiety disorder, unspecified: Secondary | ICD-10-CM

## 2017-10-09 DIAGNOSIS — R400 Somnolence: Secondary | ICD-10-CM

## 2017-10-09 DIAGNOSIS — R413 Other amnesia: Secondary | ICD-10-CM
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
## 2017-10-09 NOTE — Patient Instructions (Addendum)
1. Bloodwork for TSH, B12  Your provider requests that you have LABS drawn today.  We share a lab with [facility] - they are located in [address] (second floor) of this building.  Once you get there, please have a seat and the phlebotomist will call your name.  If you have waited more than 15 minutes, please advise the front desk  2. Schedule home sleep study 3. Refer to [facility] for therapy for anxiety and PTSD  We have sent a referral to [facility] for THERAPY.  Please call [phone] to schedule your first appointment.   4. Follow-up in March, call for any changes  RECOMMENDATIONS FOR ALL PATIENTS WITH MEMORY PROBLEMS: 1. Continue to exercise (Recommend 30 minutes of walking everyday, or 3 hours every week) 2. Increase social interactions - continue going to [place] and enjoy social gatherings with friends and family 3. Eat healthy, avoid fried foods and eat more fruits and vegetables 4. Maintain adequate blood pressure, blood sugar, and blood cholesterol level. Reducing the risk of stroke and cardiovascular disease also helps promoting better memory. 5. Avoid stressful situations. Live a simple life and avoid aggravations. Organize your time and prepare for the next day in anticipation. 6. Sleep well, avoid any interruptions of sleep and avoid any distractions in the bedroom that may interfere with adequate sleep quality 7. Avoid sugar, avoid sweets as there is a strong link between excessive sugar intake, diabetes, and cognitive impairment We discussed the Mediterranean diet, which has been shown to help patients reduce the risk of progressive memory disorders and reduces cardiovascular risk. This includes eating fish, eat fruits and green leafy vegetables, nuts like almonds and hazelnuts, walnuts, and also use olive oil. Avoid fast foods and fried foods as much as possible. Avoid sweets and sugar as sugar use has been linked to worsening of memory function.

## 2017-10-10 LAB — VITAMIN B12: VITAMIN B 12: 710 pg/mL (ref 200–1100)

## 2017-10-10 LAB — TSH: TSH: 1.05 mIU/L (ref 0.40–4.50)

## 2017-11-07 ENCOUNTER — Ambulatory Visit (HOSPITAL_BASED_OUTPATIENT_CLINIC_OR_DEPARTMENT_OTHER): Payer: 59 | Attending: Neurology | Admitting: Internal Medicine

## 2017-11-07 DIAGNOSIS — F419 Anxiety disorder, unspecified: Secondary | ICD-10-CM | POA: Diagnosis present

## 2017-11-07 DIAGNOSIS — R413 Other amnesia: Secondary | ICD-10-CM | POA: Diagnosis not present

## 2017-11-07 DIAGNOSIS — R400 Somnolence: Secondary | ICD-10-CM | POA: Insufficient documentation

## 2017-11-07 DIAGNOSIS — G4733 Obstructive sleep apnea (adult) (pediatric): Secondary | ICD-10-CM

## 2017-11-10 ENCOUNTER — Ambulatory Visit (HOSPITAL_BASED_OUTPATIENT_CLINIC_OR_DEPARTMENT_OTHER): Payer: 59 | Admitting: Internal Medicine

## 2017-11-14 ENCOUNTER — Other Ambulatory Visit (HOSPITAL_BASED_OUTPATIENT_CLINIC_OR_DEPARTMENT_OTHER): Payer: Self-pay

## 2017-11-14 DIAGNOSIS — F419 Anxiety disorder, unspecified: Secondary | ICD-10-CM

## 2017-11-14 DIAGNOSIS — R400 Somnolence: Secondary | ICD-10-CM

## 2017-11-14 DIAGNOSIS — R413 Other amnesia: Secondary | ICD-10-CM

## 2017-11-17 DIAGNOSIS — G4733 Obstructive sleep apnea (adult) (pediatric): Secondary | ICD-10-CM

## 2017-11-17 NOTE — Procedures (Signed)
   Patient Name: Spencer Byrd, Spencer Byrd Date: 11/10/2017 Gender: Male D.O.B: [birth year] Age (years): 47 Referring Provider: Cameron Sprang Height (inches): 24 Interpreting Physician: Baird Lyons MD, ABSM Weight (lbs): 169 RPSGT: Jacolyn Reedy BMI: 37 MRN: [id number] Neck Size: 15.00  CLINICAL INFORMATION Sleep Study Type: HST Indication for sleep study: Daytime Fatigue  Epworth Sleepiness Score: N/A  SLEEP STUDY TECHNIQUE A multi-channel overnight portable sleep study was performed. The channels recorded were: nasal airflow, thoracic respiratory movement, and oxygen saturation with a pulse oximetry. Snoring was also monitored.  MEDICATIONS Patient self administered medications include: none reported.  SLEEP ARCHITECTURE Patient was studied for 694 minutes. The sleep efficiency was 97.3 % and the patient was supine for 91.2%. The arousal index was 0.0 per hour.  RESPIRATORY PARAMETERS The overall AHI was 13.6 per hour, with a central apnea index of 0.0 per hour.  The oxygen nadir was 87% during sleep.  CARDIAC DATA Mean heart rate during sleep was 50.6 bpm.  IMPRESSIONS - Mild obstructive sleep apnea occurred during this study (AHI = 13.6/h). - No significant central sleep apnea occurred during this study (CAI = 0.0/h). - Mild oxygen desaturation was noted during this study (Min O2 = 87%, Mean 95%). - Patient snored.  DIAGNOSIS - Obstructive Sleep Apnea (327.23 [G47.33 ICD-10])  RECOMMENDATIONS - Suggest CPAP titration sleep study or DME autopap. Other options including a fitted oral appliance and ENT evaluation, would be based on clinical judgment. - Be careful with alcohol, sedatives and other CNS depressants that may worsen sleep apnea and disrupt normal sleep architecture. - Sleep hygiene should be reviewed to assess factors that may improve sleep quality. - Weight management and regular exercise should be initiated or continued.  [Electronically signed]  11/17/2017 10:05 AM  Baird Lyons MD, ABSM Diplomate, American Board of Sleep Medicine   NPI: [id number]                          [place], [place] [facility]  ELECTRONICALLY SIGNED ON:  11/17/2017, 10:02 AM [place] PH: [phone]   FX: [phone] [place]

## 2017-11-20 ENCOUNTER — Ambulatory Visit (HOSPITAL_COMMUNITY): Payer: 59 | Admitting: Licensed Clinical Social Worker

## 2017-11-20 NOTE — Progress Notes (Signed)
Pls let patient know the sleep study showed sleep apnea and recommended using a CPAP machine. Does he want [place] to order the CPAP or see a sleep specialist first? Thanks

## 2017-11-21 ENCOUNTER — Telehealth: Payer: Self-pay

## 2017-11-21 DIAGNOSIS — G4730 Sleep apnea, unspecified: Secondary | ICD-10-CM

## 2017-11-21 NOTE — Telephone Encounter (Deleted)
-----   Message from Cameron Sprang, MD sent at 11/20/2017  9:54 AM EDT -----   ----- Message ----- From: Deneise Lever, MD Sent: 11/17/2017  10:12 AM EDT To: Cameron Sprang, MD

## 2017-11-21 NOTE — Telephone Encounter (Signed)
LMOM asking for return call to relay message below from Dr. Delice Lesch   **Pls let patient know the sleep study showed sleep apnea and recommended using a CPAP machine. Does he want [place] to order the CPAP or see a sleep specialist first?**

## 2017-11-21 NOTE — Addendum Note (Signed)
Addended by: Lenny Pastel on: 11/21/2017 02:18 PM   Modules accepted: Orders

## 2017-11-21 NOTE — Telephone Encounter (Signed)
Orders place in Epic

## 2017-11-21 NOTE — Telephone Encounter (Signed)
Pt returned my call.  States that he would like to see sleep specialist first.

## 2017-12-25 ENCOUNTER — Encounter

## 2017-12-25 ENCOUNTER — Ambulatory Visit (INDEPENDENT_AMBULATORY_CARE_PROVIDER_SITE_OTHER): Payer: 59 | Admitting: Licensed Clinical Social Worker

## 2017-12-25 DIAGNOSIS — F4312 Post-traumatic stress disorder, chronic: Secondary | ICD-10-CM | POA: Diagnosis not present

## 2017-12-28 ENCOUNTER — Ambulatory Visit: Payer: 59 | Admitting: Pulmonary Disease

## 2017-12-28 ENCOUNTER — Encounter: Payer: Self-pay | Admitting: Pulmonary Disease

## 2017-12-28 VITALS — BP 112/80 | HR 78 | Ht 67.0 in | Wt 165.0 lb

## 2017-12-28 DIAGNOSIS — Z87898 Personal history of other specified conditions: Secondary | ICD-10-CM

## 2017-12-28 DIAGNOSIS — G4719 Other hypersomnia: Secondary | ICD-10-CM

## 2017-12-28 DIAGNOSIS — G4733 Obstructive sleep apnea (adult) (pediatric): Secondary | ICD-10-CM

## 2017-12-28 NOTE — Patient Instructions (Signed)
Mild obstructive sleep apnea  We will initiate CPAP therapy We will refer to a DME company, they will call you to set up a CPAP machine  We will see you back in the office in about 1 to 3 months following initiation of treatment  Call with any significant concerns

## 2017-12-28 NOTE — Progress Notes (Signed)
Spencer Byrd    [id number]    [birth year]  Primary Care Physician:[place], Enid Cutter  Referring Physician: [name], PA-C [address] [place], [place] [address]  Chief complaint:   Patient with mild obstructive sleep apnea Sleep study reveals AHI of 13   HPI:  History of witnessed apneas, snoring Usually goes to bed between 9 PM and 3 AM Falls asleep in a few minutes Gets up about 7 AM Admits to dry mouth in the mornings No history of MVA No family history of OSA known Some difficulty with his memory  Occupation: No pertinent history Smoking history: Never smoker  Outpatient Encounter Medications as of 12/28/2017  Medication Sig  . atorvastatin (LIPITOR) 40 MG tablet Take 40 mg by mouth daily.  . fluticasone (FLONASE) 50 MCG/ACT nasal spray    No facility-administered encounter medications on file as of 12/28/2017.     Allergies as of 12/28/2017  . (No Known Allergies)    Past Medical History:  Diagnosis Date  . High cholesterol     No past surgical history on file.  Family History  Problem Relation Age of Onset  . Cancer Mother     Social History   Socioeconomic History  . Marital status: Married    Spouse name: Not on file  . Number of children: Not on file  . Years of education: Not on file  . Highest education level: Not on file  Occupational History  . Not on file  Social Needs  . Financial resource strain: Not on file  . Food insecurity:    Worry: Not on file    Inability: Not on file  . Transportation needs:    Medical: Not on file    Non-medical: Not on file  Tobacco Use  . Smoking status: Never Smoker  . Smokeless tobacco: Never Used  Substance and Sexual Activity  . Alcohol use: No    Alcohol/week: 0.0 standard drinks    Frequency: Never    Comment: minimal/rare  . Drug use: No  . Sexual activity: Not on file  Lifestyle  . Physical activity:    Days per week: Not on file    Minutes per  session: Not on file  . Stress: Not on file  Relationships  . Social connections:    Talks on phone: Not on file    Gets together: Not on file    Attends religious service: Not on file    Active member of club or organization: Not on file    Attends meetings of clubs or organizations: Not on file    Relationship status: Not on file  . Intimate partner violence:    Fear of current or ex partner: Not on file    Emotionally abused: Not on file    Physically abused: Not on file    Forced sexual activity: Not on file  Other Topics Concern  . Not on file  Social History Narrative   Pt lives in single story home with his wife and 1 daughter   Has 3 children, 2 grandchildren   10th grade education   Works at [employer] in [place].     Review of Systems  Constitutional: Negative.   Respiratory: Positive for apnea.   Psychiatric/Behavioral: Positive for sleep disturbance.  All other systems reviewed and are negative.   Vitals:   12/28/17 1612  BP: 112/80  Pulse: 78  SpO2: 96%  Physical Exam  Constitutional: He appears well-developed and well-nourished.  HENT:  Head: Normocephalic and atraumatic.  Eyes: Right eye exhibits no discharge. Left eye exhibits no discharge.  Neck: Normal range of motion. Neck supple. No thyromegaly present.  Cardiovascular: Normal rate and regular rhythm.  Pulmonary/Chest: Effort normal and breath sounds normal. No respiratory distress. He has no wheezes.   Data Reviewed: Home sleep test reveals AHI of 13 with mild oxygen desaturation  Assessment:  Mild obstructive sleep apnea with significant daytime symptoms   Plan/Recommendations:  We will initiate treatment with auto titrating CPAP-5-15  See patient back in the office in about 1 to 3 months following initiation of treatment  Pathophysiology of sleep disordered breathing discussed with the patient Other treatment options discussed with the patient as well  Sherrilyn Rist MD [facility] 12/28/2017, 4:23 PM  CC: [name], PA-C

## 2017-12-28 NOTE — Addendum Note (Signed)
Addended by: Madolyn Frieze on: 12/28/2017 05:13 PM   Modules accepted: Orders

## 2018-01-01 ENCOUNTER — Encounter (HOSPITAL_COMMUNITY): Payer: Self-pay | Admitting: Licensed Clinical Social Worker
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
## 2018-01-05 DIAGNOSIS — Z23 Encounter for immunization: Secondary | ICD-10-CM | POA: Diagnosis not present

## 2018-01-14 DIAGNOSIS — G4733 Obstructive sleep apnea (adult) (pediatric): Secondary | ICD-10-CM | POA: Diagnosis not present

## 2018-01-30 ENCOUNTER — Ambulatory Visit (HOSPITAL_COMMUNITY): Payer: Self-pay | Admitting: Licensed Clinical Social Worker

## 2018-01-30 ENCOUNTER — Encounter

## 2018-02-13 DIAGNOSIS — G4733 Obstructive sleep apnea (adult) (pediatric): Secondary | ICD-10-CM | POA: Diagnosis not present

## 2018-03-16 DIAGNOSIS — G4733 Obstructive sleep apnea (adult) (pediatric): Secondary | ICD-10-CM | POA: Diagnosis not present

## 2018-03-21 ENCOUNTER — Telehealth: Payer: Self-pay | Admitting: Neurology

## 2018-03-21 DIAGNOSIS — R413 Other amnesia: Secondary | ICD-10-CM

## 2018-03-21 NOTE — Telephone Encounter (Signed)
I don't see any mention in LOV notes about MRI or CT.  pls advise.

## 2018-03-21 NOTE — Telephone Encounter (Signed)
Patient is calling in about MRI or CAT scan wanting one ordered before appt in March. He said it was discussed last appt. Please call him back at [phone]. Thanks!

## 2018-03-22 NOTE — Telephone Encounter (Signed)
Orders placed in Epic

## 2018-03-22 NOTE — Telephone Encounter (Signed)
Ok to order MRI brain without contrast for memory loss, thanks

## 2018-04-01 ENCOUNTER — Encounter: Payer: Self-pay | Admitting: Pulmonary Disease

## 2018-04-01 ENCOUNTER — Ambulatory Visit: Payer: BLUE CROSS/BLUE SHIELD | Admitting: Pulmonary Disease

## 2018-04-01 VITALS — BP 104/70 | HR 71 | Ht 67.0 in | Wt 170.0 lb

## 2018-04-01 DIAGNOSIS — Z9989 Dependence on other enabling machines and devices: Secondary | ICD-10-CM | POA: Diagnosis not present

## 2018-04-01 DIAGNOSIS — G4733 Obstructive sleep apnea (adult) (pediatric): Secondary | ICD-10-CM | POA: Diagnosis not present

## 2018-04-01 NOTE — Progress Notes (Signed)
Spencer Byrd    [id number]    November 23, 1970  Primary Care Physician:Redmon, Enid Cutter  Referring Physician: [name], PA-C [address] [place], [place] [address]  Chief complaint:   Patient with mild obstructive sleep apnea Sleep study reveals AHI of 13  Has been on CPAP therapy-improvement in symptoms  HPI:  Has been on CPAP with improvement in symptoms, more restorative sleep History of witnessed apneas, snoring Usually goes to bed between 9 PM and 3 AM Falls asleep in a few minutes Gets up about 7 AM Admits to dry mouth in the mornings No history of MVA No family history of OSA known Some difficulty with his memory  Occupation: No pertinent history Smoking history: Never smoker  Outpatient Encounter Medications as of 04/01/2018  Medication Sig  . atorvastatin (LIPITOR) 40 MG tablet Take 40 mg by mouth daily.  . fluticasone (FLONASE) 50 MCG/ACT nasal spray    No facility-administered encounter medications on file as of 04/01/2018.     Allergies as of 04/01/2018  . (No Known Allergies)    Past Medical History:  Diagnosis Date  . High cholesterol     No past surgical history on file.  Family History  Problem Relation Age of Onset  . Cancer Mother     Social History   Socioeconomic History  . Marital status: Married    Spouse name: Not on file  . Number of children: Not on file  . Years of education: Not on file  . Highest education level: Not on file  Occupational History  . Not on file  Social Needs  . Financial resource strain: Not on file  . Food insecurity:    Worry: Not on file    Inability: Not on file  . Transportation needs:    Medical: Not on file    Non-medical: Not on file  Tobacco Use  . Smoking status: Never Smoker  . Smokeless tobacco: Never Used  Substance and Sexual Activity  . Alcohol use: No    Alcohol/week: 0.0 standard drinks    Frequency: Never    Comment: minimal/rare  . Drug use:  No  . Sexual activity: Not on file  Lifestyle  . Physical activity:    Days per week: Not on file    Minutes per session: Not on file  . Stress: Not on file  Relationships  . Social connections:    Talks on phone: Not on file    Gets together: Not on file    Attends religious service: Not on file    Active member of club or organization: Not on file    Attends meetings of clubs or organizations: Not on file    Relationship status: Not on file  . Intimate partner violence:    Fear of current or ex partner: Not on file    Emotionally abused: Not on file    Physically abused: Not on file    Forced sexual activity: Not on file  Other Topics Concern  . Not on file  Social History Narrative   Pt lives in single story home with his wife and 1 daughter   Has 3 children, 2 grandchildren   10th grade education   Works at [employer] in [place].     Review of Systems  Constitutional: Negative.  Negative for fatigue.  HENT: Negative.   Respiratory: Positive for apnea.   Psychiatric/Behavioral: Positive for sleep disturbance.  All  other systems reviewed and are negative.   Vitals:   04/01/18 1552  BP: 104/70  Pulse: 71  SpO2: 100%   Physical Exam  Constitutional: He appears well-developed and well-nourished.  HENT:  Head: Normocephalic and atraumatic.  Eyes: Right eye exhibits no discharge. Left eye exhibits no discharge.  Neck: Normal range of motion. Neck supple. No thyromegaly present.  Cardiovascular: Normal rate and regular rhythm.  Pulmonary/Chest: Effort normal and breath sounds normal. No respiratory distress. He has no wheezes.   Data Reviewed: Home sleep test reveals AHI of 13 with mild oxygen desaturation Excellent compliance Compliance data reveals 87% compliance, machine set at 5-15, median pressure of 5.4, residual AHI 1.7, no significant leaks Assessment:  Mild obstructive sleep apnea with significant daytime symptoms -Improvement in  symptoms with use of CPAP  Download noted   Plan/Recommendations: Continue auto titrating CPAP  See patient back in the office in about 6 months  Call with any significant concerns  Sherrilyn Rist MD [facility] 04/01/2018, 4:01 PM  CC: Redmon, Barth Kirks, PA-C

## 2018-04-01 NOTE — Patient Instructions (Signed)
Obstructive sleep apnea Excellent compliance  Continue CPAP use on a regular basis I will see you back in the office in about 6 months  Call with any significant concerns

## 2018-04-02 ENCOUNTER — Ambulatory Visit
Admission: RE | Admit: 2018-04-02 | Discharge: 2018-04-02 | Disposition: A | Discharge status: Home / Self Care | Payer: BLUE CROSS/BLUE SHIELD | Source: Ambulatory Visit | Attending: Neurology | Admitting: Neurology

## 2018-04-02 DIAGNOSIS — R413 Other amnesia: Secondary | ICD-10-CM

## 2018-04-08 ENCOUNTER — Telehealth: Payer: Self-pay | Admitting: Neurology

## 2018-04-08 NOTE — Telephone Encounter (Signed)
Patient is calling in about needing recent MRI results for a referral for Out patient at [facility]. Please call patient back at [phone]. He was a little confused as to if that's all he needed. Thanks!

## 2018-04-08 NOTE — Telephone Encounter (Signed)
pls see MyChart exchange

## 2018-04-09 ENCOUNTER — Telehealth: Payer: Self-pay | Admitting: Neurology

## 2018-04-09 DIAGNOSIS — F419 Anxiety disorder, unspecified: Secondary | ICD-10-CM

## 2018-04-09 NOTE — Telephone Encounter (Signed)
Ok, Dx: Anxiety. Thanks!

## 2018-04-09 NOTE — Telephone Encounter (Signed)
Ok to send?  Dx?

## 2018-04-09 NOTE — Telephone Encounter (Signed)
Order placed

## 2018-04-09 NOTE — Telephone Encounter (Signed)
Patient called needing a referral sent to the [facility] through [facility]. The Fax number is [phone]. Please Call. Thanks

## 2018-04-17 ENCOUNTER — Ambulatory Visit (INDEPENDENT_AMBULATORY_CARE_PROVIDER_SITE_OTHER): Payer: BLUE CROSS/BLUE SHIELD | Admitting: Psychiatry

## 2018-04-17 DIAGNOSIS — F4312 Post-traumatic stress disorder, chronic: Secondary | ICD-10-CM | POA: Diagnosis not present

## 2018-04-18 ENCOUNTER — Encounter (HOSPITAL_COMMUNITY): Payer: Self-pay | Admitting: Psychiatry
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
## 2018-04-23 ENCOUNTER — Encounter: Payer: Self-pay | Admitting: Neurology

## 2018-04-23 ENCOUNTER — Other Ambulatory Visit: Payer: Self-pay

## 2018-04-23 ENCOUNTER — Ambulatory Visit: Payer: BLUE CROSS/BLUE SHIELD | Admitting: Neurology

## 2018-04-23 VITALS — BP 116/80 | HR 58 | Ht 67.0 in | Wt 170.0 lb

## 2018-04-23 DIAGNOSIS — R413 Other amnesia: Secondary | ICD-10-CM

## 2018-04-23 DIAGNOSIS — G4730 Sleep apnea, unspecified: Secondary | ICD-10-CM | POA: Diagnosis not present

## 2018-04-23 DIAGNOSIS — F419 Anxiety disorder, unspecified: Secondary | ICD-10-CM | POA: Diagnosis not present

## 2018-04-23 NOTE — Patient Instructions (Signed)
Continue working with your therapist with the CBT, stress management. Continue CPAP. Follow-up as needed, call for any changes.  RECOMMENDATIONS FOR ALL PATIENTS WITH MEMORY PROBLEMS: 1. Continue to exercise (Recommend 30 minutes of walking everyday, or 3 hours every week) 2. Increase social interactions - continue going to [place] and enjoy social gatherings with friends and family 3. Eat healthy, avoid fried foods and eat more fruits and vegetables 4. Maintain adequate blood pressure, blood sugar, and blood cholesterol level. Reducing the risk of stroke and cardiovascular disease also helps promoting better memory. 5. Avoid stressful situations. Live a simple life and avoid aggravations. Organize your time and prepare for the next day in anticipation. 6. Sleep well, avoid any interruptions of sleep and avoid any distractions in the bedroom that may interfere with adequate sleep quality 7. Avoid sugar, avoid sweets as there is a strong link between excessive sugar intake, diabetes, and cognitive impairment The Mediterranean diet has been shown to help patients reduce the risk of progressive memory disorders and reduces cardiovascular risk. This includes eating fish, eat fruits and green leafy vegetables, nuts like almonds and hazelnuts, walnuts, and also use olive oil. Avoid fast foods and fried foods as much as possible. Avoid sweets and sugar as sugar use has been linked to worsening of memory function.
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
## 2018-04-25 DIAGNOSIS — G4733 Obstructive sleep apnea (adult) (pediatric): Secondary | ICD-10-CM | POA: Diagnosis not present

## 2018-05-01 ENCOUNTER — Ambulatory Visit (HOSPITAL_COMMUNITY): Payer: Self-pay | Admitting: Psychiatry

## 2018-05-06 ENCOUNTER — Ambulatory Visit (INDEPENDENT_AMBULATORY_CARE_PROVIDER_SITE_OTHER): Payer: BLUE CROSS/BLUE SHIELD | Admitting: Psychiatry

## 2018-05-06 ENCOUNTER — Other Ambulatory Visit: Payer: Self-pay

## 2018-05-06 ENCOUNTER — Encounter (HOSPITAL_COMMUNITY): Payer: Self-pay | Admitting: Psychiatry

## 2018-05-06 DIAGNOSIS — F4312 Post-traumatic stress disorder, chronic: Secondary | ICD-10-CM
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
## 2018-05-26 DIAGNOSIS — G4733 Obstructive sleep apnea (adult) (pediatric): Secondary | ICD-10-CM | POA: Diagnosis not present

## 2018-06-03 ENCOUNTER — Other Ambulatory Visit: Payer: Self-pay

## 2018-06-03 ENCOUNTER — Ambulatory Visit (INDEPENDENT_AMBULATORY_CARE_PROVIDER_SITE_OTHER): Payer: BLUE CROSS/BLUE SHIELD | Admitting: Psychiatry

## 2018-06-03 ENCOUNTER — Encounter (HOSPITAL_COMMUNITY): Payer: Self-pay | Admitting: Psychiatry

## 2018-06-03 DIAGNOSIS — F4312 Post-traumatic stress disorder, chronic: Secondary | ICD-10-CM

## 2018-06-03 NOTE — Progress Notes (Signed)
Virtual Visit via Video Note  I connected with Spencer Byrd on 06/03/18 at  3:30 PM EDT by a video enabled telemedicine application and verified that I am speaking with the correct person using two identifiers.   I discussed the limitations of evaluation and management by telemedicine and the availability of in person appointments. The patient expressed understanding and agreed to proceed.  History of Present Illness: PTSD related to past incarcerations and losses associated with former lifestyle.    Observations/Objective: Counselor met with Spencer Byrd via Webex for individual therapy. Counselor assessed PTSD and Mental Health symptoms. Spencer Byrd denied any increased anxiety, depression and trauma responses. Counselor assessed his daily functioning, stability and safety. Counselor and Spencer Byrd processed grief and loss issues associated with the one year anniversary of his mom passing. Counselor assessed connections with his support system. Spencer Byrd reports that overall he and his wife have not been negatively impacted by COVID-19 and that he is now experiencing any issues of concern at this time.   Assessment and Plan: We discussed meeting again in 2 weeks at his appointed time for follow-up on treatment plan goals.   Follow Up Instructions: Counselor will provide a Webex link to Spencer Byrd before the next session.    I discussed the assessment and treatment plan with the patient. The patient was provided an opportunity to ask questions and all were answered. The patient agreed with the plan and demonstrated an understanding of the instructions.   The patient was advised to call back or seek an in-person evaluation if the symptoms worsen or if the condition fails to improve as anticipated.  I provided 45 minutes of non-face-to-face time during this encounter.   Bethany Morris, LCSW  

## 2018-06-13 DIAGNOSIS — F4310 Post-traumatic stress disorder, unspecified: Secondary | ICD-10-CM | POA: Diagnosis not present

## 2018-06-13 DIAGNOSIS — J309 Allergic rhinitis, unspecified: Secondary | ICD-10-CM | POA: Diagnosis not present

## 2018-06-13 DIAGNOSIS — E7800 Pure hypercholesterolemia, unspecified: Secondary | ICD-10-CM | POA: Diagnosis not present

## 2018-06-13 DIAGNOSIS — G4730 Sleep apnea, unspecified: Secondary | ICD-10-CM | POA: Diagnosis not present

## 2018-06-13 DIAGNOSIS — Z0000 Encounter for general adult medical examination without abnormal findings: Secondary | ICD-10-CM | POA: Diagnosis not present

## 2018-06-17 ENCOUNTER — Ambulatory Visit (INDEPENDENT_AMBULATORY_CARE_PROVIDER_SITE_OTHER): Payer: BLUE CROSS/BLUE SHIELD | Admitting: Psychiatry

## 2018-06-17 ENCOUNTER — Other Ambulatory Visit: Payer: Self-pay

## 2018-06-17 DIAGNOSIS — R413 Other amnesia: Secondary | ICD-10-CM

## 2018-06-17 DIAGNOSIS — F4312 Post-traumatic stress disorder, chronic: Secondary | ICD-10-CM | POA: Diagnosis not present

## 2018-06-18 ENCOUNTER — Encounter (HOSPITAL_COMMUNITY): Payer: Self-pay | Admitting: Psychiatry

## 2018-06-18 NOTE — Progress Notes (Signed)
Virtual Visit via Video Note  I connected with Spencer Byrd on 06/18/18 at  4:30 PM EDT by a video enabled telemedicine application and verified that I am speaking with the correct person using two identifiers.   I discussed the limitations of evaluation and management by telemedicine and the availability of in person appointments. The patient expressed understanding and agreed to proceed.  History of Present Illness: Chronic PTSD and Memory Difficulties due to adverse life experiences and potential medical conditions.    Observations/Objective: Counselor met with Spencer Byrd for individual therapy via Webex. Counselor assessed mental health symptoms. Spencer Byrd shared that he's doing ok in regard to trauma triggers, anxiety and depression, but his primary concern is his memory issues. Counselor and Spencer Byrd spent our time together assessing daily functioning, routine, and doctor's recommendations regarding ways to improve memory loss or difficulties. Counselor encouraged him in his efforts and shared psychoeducation about the coping skills.    Assessment and Plan: Counselor and Spencer Byrd will continue meeting every other week. Spencer Byrd will implement coping strategies.   Follow Up Instructions: Counselor will set up next Webex session.    I discussed the assessment and treatment plan with the patient. The patient was provided an opportunity to ask questions and all were answered. The patient agreed with the plan and demonstrated an understanding of the instructions.   The patient was advised to call back or seek an in-person evaluation if the symptoms worsen or if the condition fails to improve as anticipated.  I provided 50 minutes of non-face-to-face time during this encounter.   Lise Auer, LCSW

## 2018-06-25 DIAGNOSIS — G4733 Obstructive sleep apnea (adult) (pediatric): Secondary | ICD-10-CM | POA: Diagnosis not present

## 2018-07-01 ENCOUNTER — Other Ambulatory Visit: Payer: Self-pay

## 2018-07-01 ENCOUNTER — Ambulatory Visit (INDEPENDENT_AMBULATORY_CARE_PROVIDER_SITE_OTHER): Payer: BLUE CROSS/BLUE SHIELD | Admitting: Psychiatry

## 2018-07-01 DIAGNOSIS — F4312 Post-traumatic stress disorder, chronic: Secondary | ICD-10-CM

## 2018-07-01 DIAGNOSIS — E7800 Pure hypercholesterolemia, unspecified: Secondary | ICD-10-CM | POA: Diagnosis not present

## 2018-07-01 DIAGNOSIS — R413 Other amnesia: Secondary | ICD-10-CM

## 2018-07-01 DIAGNOSIS — Z125 Encounter for screening for malignant neoplasm of prostate: Secondary | ICD-10-CM | POA: Diagnosis not present

## 2018-07-02 ENCOUNTER — Encounter (HOSPITAL_COMMUNITY): Payer: Self-pay | Admitting: Psychiatry
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
## 2018-07-26 DIAGNOSIS — Z209 Contact with and (suspected) exposure to unspecified communicable disease: Secondary | ICD-10-CM | POA: Diagnosis not present

## 2018-07-26 DIAGNOSIS — G4733 Obstructive sleep apnea (adult) (pediatric): Secondary | ICD-10-CM | POA: Diagnosis not present

## 2018-08-12 ENCOUNTER — Ambulatory Visit (INDEPENDENT_AMBULATORY_CARE_PROVIDER_SITE_OTHER): Payer: BC Managed Care – PPO | Admitting: Psychiatry

## 2018-08-12 ENCOUNTER — Other Ambulatory Visit: Payer: Self-pay

## 2018-08-12 DIAGNOSIS — F4312 Post-traumatic stress disorder, chronic: Secondary | ICD-10-CM | POA: Diagnosis not present

## 2018-08-12 DIAGNOSIS — R413 Other amnesia: Secondary | ICD-10-CM | POA: Diagnosis not present

## 2018-08-14 ENCOUNTER — Encounter (HOSPITAL_COMMUNITY): Payer: Self-pay | Admitting: Psychiatry

## 2018-08-14 NOTE — Progress Notes (Signed)
Virtual Visit via Video Note  I connected with Keldan Declan Mier on 08/14/18 at  3:00 PM EDT by a video enabled telemedicine application and verified that I am speaking with the correct person using two identifiers.  Location: Patient: Camryn Camilo Provider: Lise Auer, LCSW   I discussed the limitations of evaluation and management by telemedicine and the availability of in person appointments. The patient expressed understanding and agreed to proceed.  History of Present Illness: Memory difficulties and chronic PTSD   Observations/Objective: Counselor met with  for individual therapy via Webex. Counselor assessed MH symptoms and progress on treatment plan goals. Roben denied suicidal ideation or self-harm behaviors. Laydon shared that he has been using and enjoying the memory apps on his phone and can see some positive changes. Counselor assessed daily functioning and identified that [place] continues to struggle with energy, diet, memory, and motivation. Together we used solution-focused interventions to find alternative ways to address these problems. Counselor encouraged Tahmir to take medications as prescribed, follow doctors recommendations for memory improvement, to be intentional in his relationship with his wife, and to follow up with medical appointments to share progress on his symptoms.   Assessment and Plan: Counselor will continue to meet with Jyquan to address treatment plan goals. Breckyn will continue to follow recommendations of providers and implement skills learned in session.  Follow Up Instructions: Counselor will send information for next session via Webex.    I discussed the assessment and treatment plan with the patient. The patient was provided an opportunity to ask questions and all were answered. The patient agreed with the plan and demonstrated an understanding of the instructions.   The patient was advised to call back or seek an in-person evaluation if the symptoms worsen or if  the condition fails to improve as anticipated.  I provided 55 minutes of non-face-to-face time during this encounter.   Lise Auer, LCSW

## 2018-08-20 DIAGNOSIS — N289 Disorder of kidney and ureter, unspecified: Secondary | ICD-10-CM | POA: Diagnosis not present

## 2018-08-22 ENCOUNTER — Other Ambulatory Visit: Payer: Self-pay

## 2018-08-22 DIAGNOSIS — R4189 Other symptoms and signs involving cognitive functions and awareness: Secondary | ICD-10-CM

## 2018-08-25 DIAGNOSIS — G4733 Obstructive sleep apnea (adult) (pediatric): Secondary | ICD-10-CM | POA: Diagnosis not present

## 2018-09-23 ENCOUNTER — Ambulatory Visit (HOSPITAL_COMMUNITY): Payer: BC Managed Care – PPO | Admitting: Psychiatry

## 2018-09-25 DIAGNOSIS — G4733 Obstructive sleep apnea (adult) (pediatric): Secondary | ICD-10-CM | POA: Diagnosis not present

## 2018-10-26 DIAGNOSIS — G4733 Obstructive sleep apnea (adult) (pediatric): Secondary | ICD-10-CM | POA: Diagnosis not present

## 2018-11-25 DIAGNOSIS — G4733 Obstructive sleep apnea (adult) (pediatric): Secondary | ICD-10-CM | POA: Diagnosis not present

## 2019-01-20 DIAGNOSIS — G4733 Obstructive sleep apnea (adult) (pediatric): Secondary | ICD-10-CM | POA: Diagnosis not present

## 2019-02-17 ENCOUNTER — Ambulatory Visit: Payer: BC Managed Care – PPO | Attending: Internal Medicine

## 2019-02-17 DIAGNOSIS — Z20828 Contact with and (suspected) exposure to other viral communicable diseases: Secondary | ICD-10-CM | POA: Diagnosis not present

## 2019-02-17 DIAGNOSIS — Z20822 Contact with and (suspected) exposure to covid-19: Secondary | ICD-10-CM

## 2019-02-19 LAB — NOVEL CORONAVIRUS, NAA: SARS-CoV-2, NAA: DETECTED — AB

## 2019-02-27 ENCOUNTER — Ambulatory Visit: Payer: BC Managed Care – PPO | Attending: Internal Medicine

## 2019-02-27 DIAGNOSIS — Z20822 Contact with and (suspected) exposure to covid-19: Secondary | ICD-10-CM

## 2019-03-01 LAB — NOVEL CORONAVIRUS, NAA: SARS-CoV-2, NAA: NOT DETECTED

## 2019-06-25 ENCOUNTER — Ambulatory Visit: Payer: Managed Care, Other (non HMO) | Admitting: Neurology

## 2019-06-25 ENCOUNTER — Other Ambulatory Visit: Payer: Self-pay

## 2019-06-25 ENCOUNTER — Encounter: Payer: Self-pay | Admitting: Neurology

## 2019-06-25 VITALS — BP 148/79 | HR 72 | Ht 67.0 in | Wt 171.2 lb

## 2019-06-25 DIAGNOSIS — R413 Other amnesia: Secondary | ICD-10-CM

## 2019-06-25 DIAGNOSIS — F419 Anxiety disorder, unspecified: Secondary | ICD-10-CM

## 2019-06-25 MED ORDER — SERTRALINE HCL 25 MG PO TABS: 25.0000 mg | ORAL_TABLET | Freq: Every day | ORAL | 11 refills | Status: DC

## 2019-06-25 NOTE — Patient Instructions (Signed)
1. Schedule routine EEG  2. Start Zoloft 25mg  daily. Call our office for an update in a month, if no side effects, we will increase dose if needed  3. Refer to [facility] for counseling for anxiety and PTSD  4. Follow-up in 6 months, call for any changes   RECOMMENDATIONS FOR ALL PATIENTS WITH MEMORY PROBLEMS: 1. Continue to exercise (Recommend 30 minutes of walking everyday, or 3 hours every week) 2. Increase social interactions - continue going to [place] and enjoy social gatherings with friends and family 3. Eat healthy, avoid fried foods and eat more fruits and vegetables 4. Maintain adequate blood pressure, blood sugar, and blood cholesterol level. Reducing the risk of stroke and cardiovascular disease also helps promoting better memory. 5. Avoid stressful situations. Live a simple life and avoid aggravations. Organize your time and prepare for the next day in anticipation. 6. Sleep well, avoid any interruptions of sleep and avoid any distractions in the bedroom that may interfere with adequate sleep quality 7. Avoid sugar, avoid sweets as there is a strong link between excessive sugar intake, diabetes, and cognitive impairment We discussed the Mediterranean diet, which has been shown to help patients reduce the risk of progressive memory disorders and reduces cardiovascular risk. This includes eating fish, eat fruits and green leafy vegetables, nuts like almonds and hazelnuts, walnuts, and also use olive oil. Avoid fast foods and fried foods as much as possible. Avoid sweets and sugar as sugar use has been linked to worsening of memory function.
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
## 2019-06-28 ENCOUNTER — Ambulatory Visit: Payer: Managed Care, Other (non HMO) | Attending: Internal Medicine

## 2019-06-28 DIAGNOSIS — Z23 Encounter for immunization: Secondary | ICD-10-CM

## 2019-06-28 NOTE — Progress Notes (Signed)
   Covid-19 Vaccination Clinic  Name:  Spencer Byrd    MRN: [id number] DOB: 1970-05-25  06/28/2019  [name] was observed post Covid-19 immunization for 15 minutes without incident. He was provided with Vaccine Information Sheet and instruction to access the V-Safe system.   [name] was instructed to call 911 with any severe reactions post vaccine: Marland Kitchen Difficulty breathing  . Swelling of face and throat  . A fast heartbeat  . A bad rash all over body  . Dizziness and weakness   Immunizations Administered    Name Date Dose VIS Date Route   Pfizer COVID-19 Vaccine 06/28/2019  9:24 AM 0.3 mL 04/16/2018 Intramuscular   Manufacturer: [place]   Lot: [id number]   [place]: [id number]

## 2019-06-30 ENCOUNTER — Other Ambulatory Visit: Payer: Self-pay

## 2019-06-30 ENCOUNTER — Ambulatory Visit (INDEPENDENT_AMBULATORY_CARE_PROVIDER_SITE_OTHER): Payer: Managed Care, Other (non HMO) | Admitting: Neurology

## 2019-06-30 DIAGNOSIS — R413 Other amnesia: Secondary | ICD-10-CM | POA: Diagnosis not present

## 2019-06-30 DIAGNOSIS — F419 Anxiety disorder, unspecified: Secondary | ICD-10-CM

## 2019-07-01 NOTE — Procedures (Signed)
ELECTROENCEPHALOGRAM REPORT  Date of Study: 06/30/2019  Patient's Name: Spencer Byrd MRN: [id number] Date of Birth: [birth year]  Referring Provider: Dr. Ellouise Newer  Clinical History: This is a 49 year old man with memory loss and episodes of staring.   Medications: Zoloft, Lipitor  Technical Summary: A multichannel digital EEG recording measured by the international 10-20 system with electrodes applied with paste and impedances below 5000 ohms performed in our laboratory with EKG monitoring in an awake and asleep patient.  Hyperventilation was not performed. Photic stimulation was performed.  The digital EEG was referentially recorded, reformatted, and digitally filtered in a variety of bipolar and referential montages for optimal display.    Description: The patient is awake and asleep during the recording.  During maximal wakefulness, there is a symmetric, medium voltage 10 Hz posterior dominant rhythm that attenuates with eye opening.  The record is symmetric.  During drowsiness and sleep, there is an increase in theta slowing of the background.  Vertex waves and symmetric sleep spindles were seen.  Hyperventilation and photic stimulation did not elicit any abnormalities.  There were no epileptiform discharges or electrographic seizures seen.    EKG lead was unremarkable.  Impression: This awake and asleep EEG is normal.    Clinical Correlation: A normal EEG does not exclude a clinical diagnosis of epilepsy.  If further clinical questions remain, prolonged EEG may be helpful.  Clinical correlation is advised.   Ellouise Newer, M.D.

## 2019-07-17 ENCOUNTER — Other Ambulatory Visit: Payer: Self-pay | Admitting: Neurology

## 2019-07-22 ENCOUNTER — Ambulatory Visit: Payer: Managed Care, Other (non HMO) | Attending: Internal Medicine

## 2019-07-22 DIAGNOSIS — Z23 Encounter for immunization: Secondary | ICD-10-CM

## 2019-07-22 NOTE — Progress Notes (Signed)
   Covid-19 Vaccination Clinic  Name:  Spencer Byrd    MRN: [id number] DOB: 01-29-1971  07/22/2019  Mr. Feith was observed post Covid-19 immunization for 15 minutes without incident. He was provided with Vaccine Information Sheet and instruction to access the V-Safe system.   Mr. Lainhart was instructed to call 911 with any severe reactions post vaccine: Marland Kitchen Difficulty breathing  . Swelling of face and throat  . A fast heartbeat  . A bad rash all over body  . Dizziness and weakness   Immunizations Administered    Name Date Dose VIS Date Route   Pfizer COVID-19 Vaccine 07/22/2019  4:53 PM 0.3 mL 04/16/2018 Intramuscular   Manufacturer: [employer]   Lot: [id number]   [place]: [id number]

## 2019-09-12 IMAGING — MR MR HEAD W/O CM
10 series · 48 of 48 positions shown · non-contrast
Comparison: None.

CLINICAL DATA: Memory loss, numbness in the hands/fingers, and
occasional stuttering. Symptoms for 3 months.

EXAM:
MRI HEAD WITHOUT CONTRAST
TECHNIQUE: Multiplanar, multiecho pulse sequences of the brain and surrounding
structures were obtained without intravenous contrast.

[Series 5: T1 · sagittal · 4.0mm · 0.75mm/px · 3 of 31 slices shown (1 of 2)]
[im 1/31]
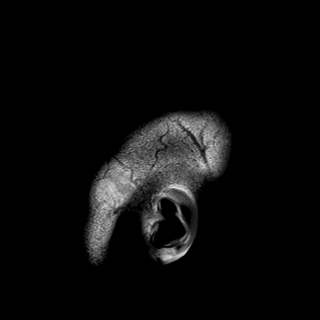
[im 16/31]
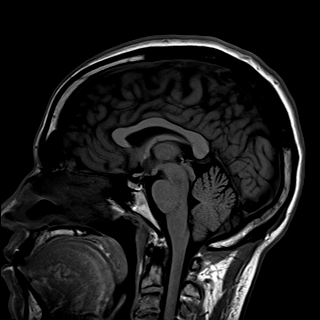
[im 31/31]
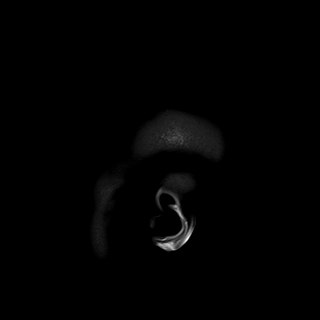

[Series 6: DWI · axial · 3.0mm · 1.50mm/px · z∈[-91,+51]mm · 7 of 88 slices shown (1 of 4)]
[im 1/88]
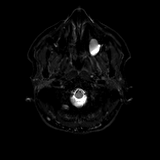
[im 15/88]
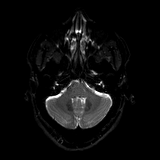
[im 30/88]
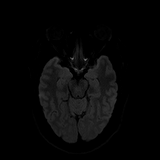
[im 44/88]
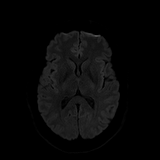
[im 59/88]
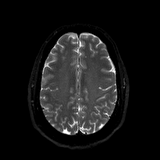
[im 73/88]
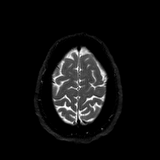
[im 88/88]
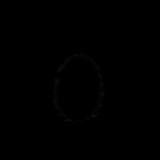

[Series 7: DWI · axial · 3.0mm · 1.50mm/px · z∈[-91,+51]mm · 4 of 44 slices shown (2 of 4)]
[im 1/44]
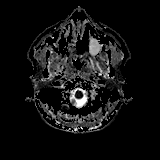
[im 15/44]
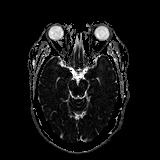
[im 29/44]
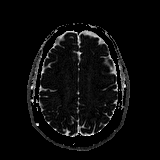
[im 44/44]
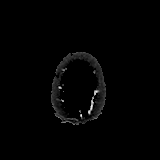

[Series 8: DWI · coronal · 5.0mm · 1.44mm/px · 5 of 62 slices shown (3 of 4)]
[im 1/62]
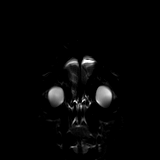
[im 16/62]
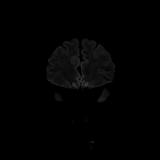
[im 31/62]
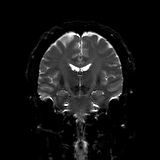
[im 46/62]
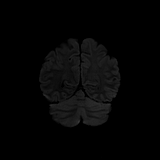
[im 62/62]
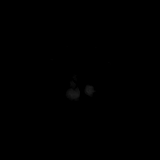

[Series 9: DWI · coronal · 5.0mm · 1.44mm/px · 2 of 31 slices shown (4 of 4)]
[im 1/31]
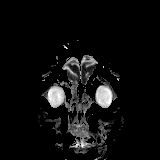
[im 31/31]
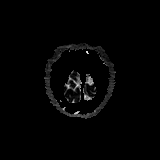

[Series 10: T2 · axial · 4.0mm · 0.36mm/px · z∈[-93,+52]mm · 2 of 29 slices shown (1 of 2)]
[im 1/29]
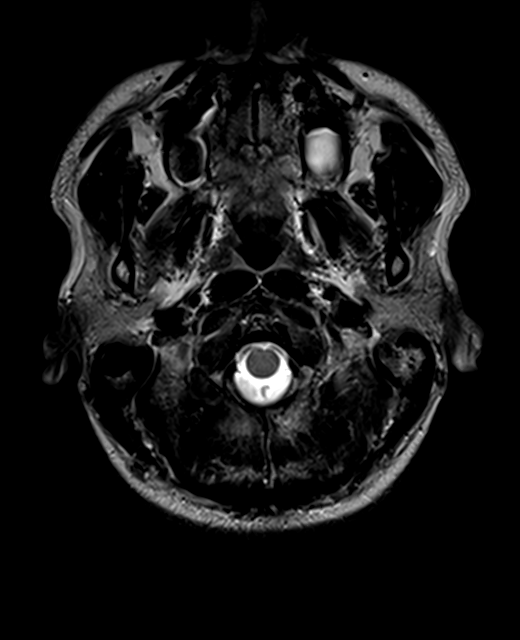
[im 29/29]
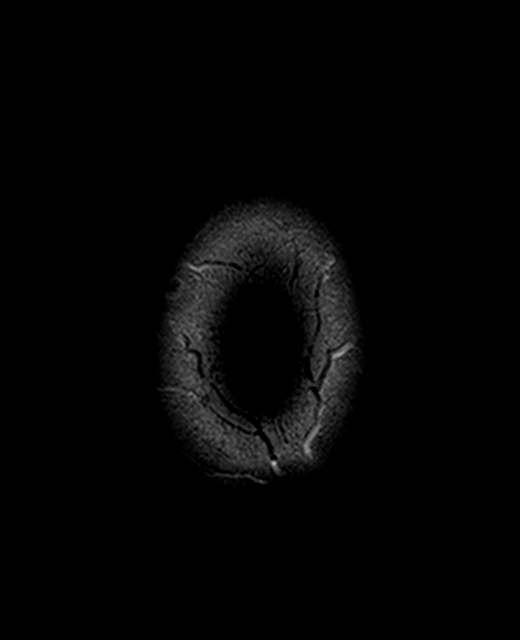

[Series 11: FLAIR · axial · 3.0mm · 0.72mm/px · z∈[-96,+53]mm · 2 of 26 slices shown]
[im 1/26]
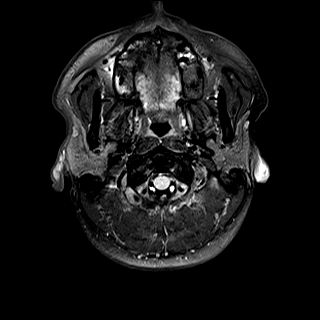
[im 26/26]
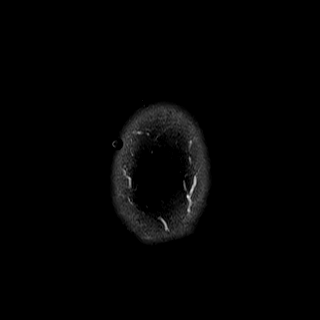

[Series 13: swi_images · axial · 1.5mm · 0.90mm/px · z∈[-91,+51]mm · 8 of 96 slices shown]
[im 1/96]
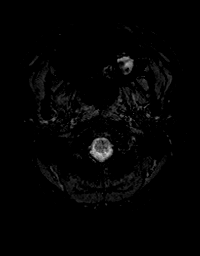
[im 14/96]
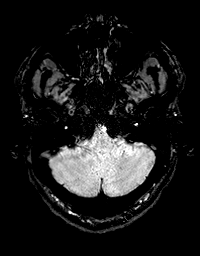
[im 28/96]
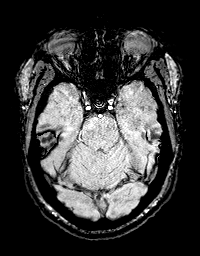
[im 41/96]
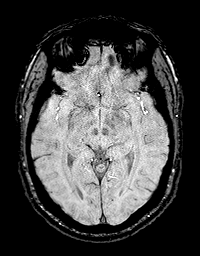
[im 55/96]
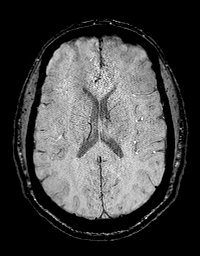
[im 68/96]
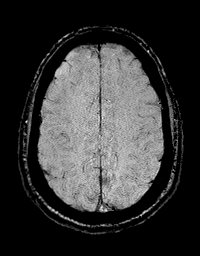
[im 82/96]
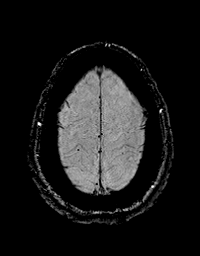
[im 96/96]
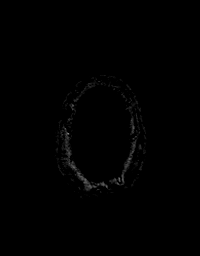

[Series 14: T1 · axial · 1.0mm · 0.90mm/px · z∈[-99,+59]mm · 13 of 160 slices shown (2 of 2)]
[im 1/160]
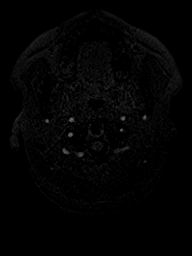
[im 14/160]
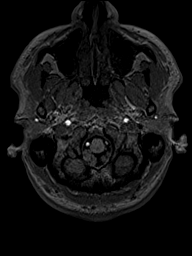
[im 27/160]
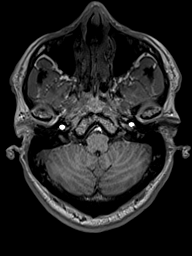
[im 40/160]
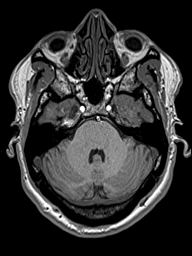
[im 54/160]
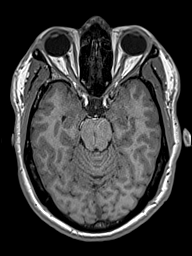
[im 67/160]
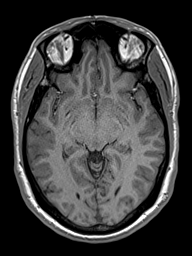
[im 80/160]
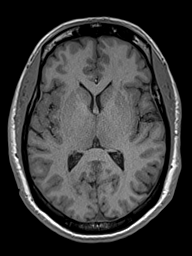
[im 93/160]
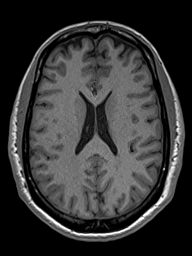
[im 107/160]
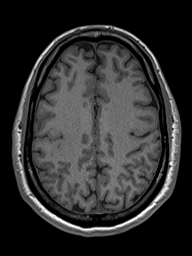
[im 120/160]
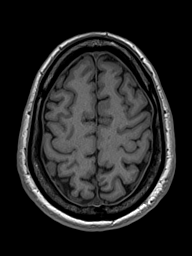
[im 133/160]
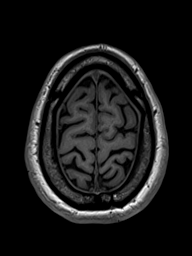
[im 146/160]
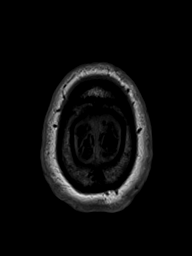
[im 160/160]
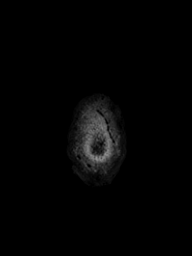

[Series 15: T2 · coronal · 4.5mm · 0.36mm/px · 2 of 30 slices shown (2 of 2)]
[im 1/30]
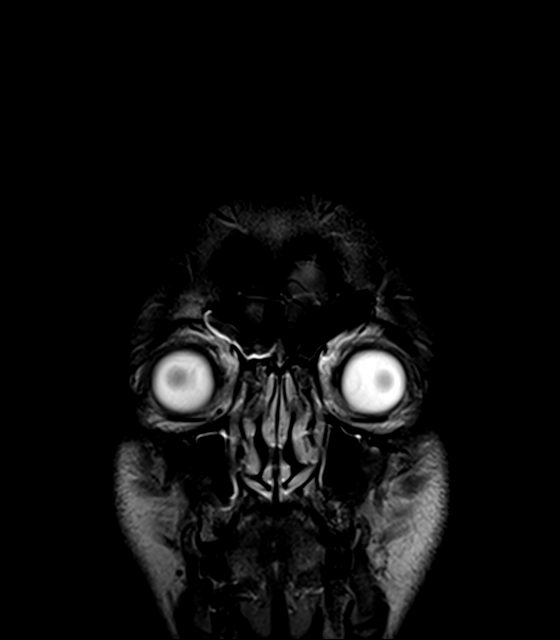
[im 30/30]
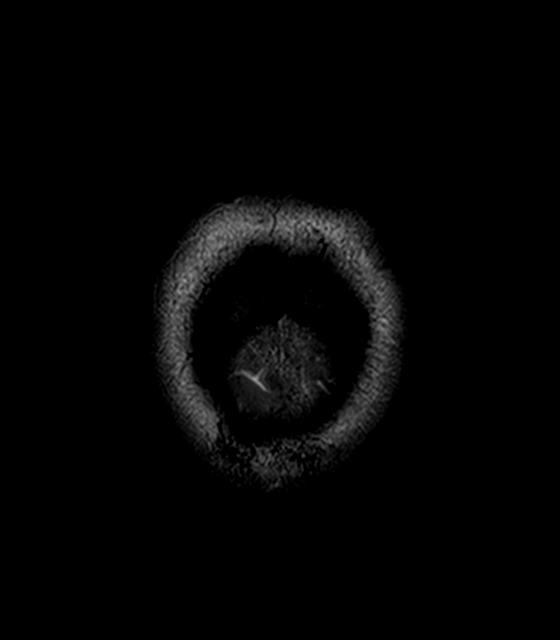

[48 of 48 positions shown; findings below may reference images not displayed]

FINDINGS: Brain: There is no evidence of acute infarct, intracranial
hemorrhage, mass, midline shift, or extra-axial fluid collection.
The ventricles and sulci are normal. A few punctate foci of T2
hyperintensity in the white matter of the frontal lobes are
nonspecific and not considered abnormal for age.

Vascular: Major intracranial vascular flow voids are preserved.

Skull and upper cervical spine: Unremarkable bone marrow signal.

Sinuses/Orbits: Unremarkable orbits. Left maxillary sinus mucous
retention cyst. Clear mastoid air cells.

Other: 3 mm cyst in the posterior nasopharynx slightly left of
midline.
IMPRESSION: Unremarkable appearance of the brain for age.

## 2019-11-12 LAB — HM COLONOSCOPY

## 2019-12-04 ENCOUNTER — Encounter: Payer: Self-pay | Admitting: Neurology

## 2020-01-06 ENCOUNTER — Ambulatory Visit: Payer: Managed Care, Other (non HMO) | Admitting: Neurology

## 2020-01-14 ENCOUNTER — Other Ambulatory Visit: Payer: Self-pay

## 2020-01-14 ENCOUNTER — Encounter: Payer: Self-pay | Admitting: Neurology

## 2020-01-14 ENCOUNTER — Ambulatory Visit (INDEPENDENT_AMBULATORY_CARE_PROVIDER_SITE_OTHER): Payer: Managed Care, Other (non HMO) | Admitting: Neurology

## 2020-01-14 VITALS — BP 122/78 | HR 60 | Ht 67.0 in | Wt 171.2 lb

## 2020-01-14 DIAGNOSIS — R413 Other amnesia: Secondary | ICD-10-CM

## 2020-01-14 DIAGNOSIS — F419 Anxiety disorder, unspecified: Secondary | ICD-10-CM | POA: Diagnosis not present

## 2020-01-14 NOTE — Patient Instructions (Addendum)
1. Schedule repeat Neurocognitive testing  2. Continue taking the Sertraline 25mg  daily on a regular basis. Call when due for refills  3. Referral will be sent for Cognitive behavioral therapy for PTSD  4. Follow-up in 6-8 months, call for any changes

## 2020-01-14 NOTE — Addendum Note (Signed)
Addended by: Jake Seats on: 01/14/2020 04:16 PM   Modules accepted: Orders
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
## 2020-01-25 ENCOUNTER — Other Ambulatory Visit: Payer: Self-pay | Admitting: Neurology

## 2020-02-17 ENCOUNTER — Encounter: Payer: Self-pay | Admitting: Psychology

## 2020-02-17 ENCOUNTER — Other Ambulatory Visit: Payer: Self-pay

## 2020-02-17 ENCOUNTER — Ambulatory Visit: Payer: 59 | Admitting: Psychology

## 2020-02-17 ENCOUNTER — Ambulatory Visit (INDEPENDENT_AMBULATORY_CARE_PROVIDER_SITE_OTHER): Payer: 59 | Admitting: Psychology

## 2020-02-17 DIAGNOSIS — F331 Major depressive disorder, recurrent, moderate: Secondary | ICD-10-CM | POA: Diagnosis not present

## 2020-02-17 DIAGNOSIS — J309 Allergic rhinitis, unspecified: Secondary | ICD-10-CM | POA: Insufficient documentation

## 2020-02-17 DIAGNOSIS — R4189 Other symptoms and signs involving cognitive functions and awareness: Secondary | ICD-10-CM

## 2020-02-17 DIAGNOSIS — F411 Generalized anxiety disorder: Secondary | ICD-10-CM | POA: Diagnosis not present

## 2020-02-17 DIAGNOSIS — G4733 Obstructive sleep apnea (adult) (pediatric): Secondary | ICD-10-CM | POA: Insufficient documentation

## 2020-02-17 DIAGNOSIS — F329 Major depressive disorder, single episode, unspecified: Secondary | ICD-10-CM | POA: Insufficient documentation

## 2020-02-17 DIAGNOSIS — F4310 Post-traumatic stress disorder, unspecified: Secondary | ICD-10-CM

## 2020-02-17 NOTE — Progress Notes (Signed)
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 separate and discrete service, Dr. Milbert Coulter spent a total of 155 minutes in interpretation and report writing billed as one unit [phone] and two units 96133.

## 2020-02-17 NOTE — Progress Notes (Signed)
   Psychometrician Note   Cognitive testing was administered to Spencer Byrd by Wallace Keller, B.S. (psychometrist) under the supervision of Dr. Newman Nickels, Ph.D., licensed psychologist on 02/17/20. Spencer Byrd did not appear overtly distressed by the testing session per behavioral observation or responses across self-report questionnaires. Dr. Newman Nickels, Ph.D. checked in with Spencer Byrd as needed to manage any distress related to testing procedures (if applicable). Rest breaks were offered.    The battery of tests administered was selected by Dr. Newman Nickels, Ph.D. with consideration to Spencer Byrd current level of functioning, the nature of his symptoms, emotional and behavioral responses during interview, level of literacy, observed level of motivation/effort, and the nature of the referral question. This battery was communicated to the psychometrist. Communication between Dr. Newman Nickels, Ph.D. and the psychometrist was ongoing throughout the evaluation and Dr. Newman Nickels, Ph.D. was immediately accessible at all times. Dr. Newman Nickels, Ph.D. provided supervision to the psychometrist on the date of this service to the extent necessary to assure the quality of all services provided.    Spencer Byrd will return within approximately 1-2 weeks for an interactive feedback session with Dr. Milbert Coulter at which time his test performances, clinical impressions, and treatment recommendations will be reviewed in detail. Spencer Byrd understands he can contact our office should he require our assistance before this time.  A total of 140 minutes of billable time were spent face-to-face with Spencer Byrd by the psychometrist. This includes both test administration and scoring time. Billing for these services is reflected in the clinical report generated by Dr. Newman Nickels, Ph.D..  This note reflects time spent with the psychometrician and does not include test scores or any clinical  interpretations made by Dr. Milbert Coulter. The full report will follow in a separate note.

## 2020-02-25 ENCOUNTER — Other Ambulatory Visit: Payer: Self-pay

## 2020-02-25 ENCOUNTER — Ambulatory Visit (INDEPENDENT_AMBULATORY_CARE_PROVIDER_SITE_OTHER): Payer: 59 | Admitting: Psychology

## 2020-02-25 DIAGNOSIS — F331 Major depressive disorder, recurrent, moderate: Secondary | ICD-10-CM

## 2020-02-25 DIAGNOSIS — R4189 Other symptoms and signs involving cognitive functions and awareness: Secondary | ICD-10-CM

## 2020-02-25 DIAGNOSIS — F411 Generalized anxiety disorder: Secondary | ICD-10-CM

## 2020-02-25 NOTE — Patient Instructions (Signed)
A combination of medication and psychotherapy has been shown to be most effective at treating symptoms of anxiety and depression. As such, Mr. Spencer Byrd is encouraged to speak with his prescribing physician regarding medication adjustments to optimally manage these symptoms.   Mr. Spencer Byrd was previously referred for individual psychotherapy (CBT). However, he stated that these individuals have not reached out to him to schedule his initial appointment. It appears that he was referred to [facility] and that his referral is currently pending. I would encourage him to call them directly and check on the status of his referral. Their phone number is: [phone].  Mr. Spencer Byrd is encouraged to attend to lifestyle factors for brain health (e.g., regular physical exercise, good nutrition habits, regular participation in cognitively-stimulating activities, and general stress management techniques), which are likely to have benefits for both emotional adjustment and cognition. In fact, in addition to promoting good general health, regular exercise incorporating aerobic activities (e.g., brisk walking, jogging, cycling, etc.) has been demonstrated to be a very effective treatment for depression and stress, with similar efficacy rates to both antidepressant medication and psychotherapy. Optimal control of vascular risk factors (including safe cardiovascular exercise and adherence to dietary recommendations) is encouraged.   If interested, there are some activities which have therapeutic value and can be useful in keeping him cognitively stimulated. For suggestions, Mr. Spencer Byrd is encouraged to go to the following website: https://www.[facility].org/get-to-know-barrow/centers-programs/neurorehabilitation-center/neuro-rehab-apps-and-games/ which has options, categorized by level of difficulty. It should be noted that these activities should not be viewed as a substitute for therapy.  When learning new  information, he would benefit from information being broken up into small, manageable pieces. He may also find it helpful to articulate the material in his own words and in a context to promote encoding at the onset of a new task. This material may need to be repeated multiple times to promote encoding.  Memory can be improved using internal strategies such as rehearsal, repetition, chunking, mnemonics, association, and imagery. External strategies such as written notes in a consistently used memory journal, visual and nonverbal auditory cues such as a calendar on the refrigerator or appointments with alarm, such as on a cell phone, can also help maximize recall.    To address problems with fluctuating attention and executive dysfunction, he may wish to consider:   -Avoiding external distractions when needing to concentrate   -Limiting exposure to fast paced environments with multiple sensory demands   -Writing down complicated information and using checklists   -Attempting and completing one task at a time (i.e., no multi-tasking)   -Verbalizing aloud each step of a task to maintain focus   -Reducing the amount of information considered at one time

## 2020-02-25 NOTE — Progress Notes (Signed)
   Neuropsychology Feedback Session Spencer Byrd. [name] Department of Neurology  Reason for Referral:   Mcclellan [name] a 50 y.o. right-handed African-American male referred by [name], M.D.,to characterize hiscurrent cognitive functioning and assist with diagnostic clarity and treatment planning in the context of subjective cognitive decline.  Feedback:   [name] completed a comprehensive neuropsychological evaluation on 02/17/2020. Please refer to that encounter for the full report and recommendations. Briefly, results suggested variability across executive functioning, with particular areas of weakness surrounding working memory and response inhibition. Weaknesses were also exhibited across verbal comprehension abilities on IQ testing; however, these were stable relative to his previous evaluation and consistent with his educational and vocational background. Relative to previous testing, the majority of the current evaluation suggested ongoing stability. Improvements were noted across verbal fluency, visuospatial abilities, and retention aspects of visual memory. No areas of testing saw clinically significant levels of cognitive decline. Overall, mood concerns represent the most likely culprit for experienced difficulties by [name]. There is no evidence to suggest the presence of a neurodegenerative condition based upon current test scores.  [name] was unaccompanied during the current feedback session. Content of the current session focused on the results of his neuropsychological evaluation. [name] was given the opportunity to ask questions and his questions were answered. He was encouraged to reach out should additional questions arise. A copy of his report was provided at the conclusion of the visit.      Less than 16 minutes were spent conducting the current feedback session with [name].

## 2020-04-15 ENCOUNTER — Telehealth: Payer: Self-pay | Admitting: Neurology

## 2020-04-15 NOTE — Telephone Encounter (Signed)
Pt stated that he is getting up at not night knowing it and not sure what he is doing. Stated it has been going on for a while. Michela Pitcher that his wife and mother-in-law have seen him doing it. He will get up and look around, he will mess with and get on his phone, we will have conversations but not remember them. Then will go get back in the bed and sleep the rest of the night,

## 2020-04-15 NOTE — Telephone Encounter (Signed)
Patient has a follow up visit with Delice Lesch on 10-04-20   He states that when he goes to bed at night he wakes up ( like he is sleep walking ) but does not remember waking up or what he did during that time. He does go back to sleep but when he gets up his wife will ask him about it and he does not remember anything he is very concern please call

## 2020-04-16 NOTE — Telephone Encounter (Signed)
Spoke with Spencer Byrd informed him that this does sound like sleepwalking. Sleep walking can be triggered by fatigue, lack of sleep, irregular sleeping habits, stress, anxiety, new medications. Recommend limiting caffeine, getting regular exercise, doing breathing exercises and if having more mood issues, speaking to his PCP about anxiety.

## 2020-04-16 NOTE — Telephone Encounter (Signed)
Pls let him know that this does sound like sleepwalking. Sleep walking can be triggered by fatigue, lack of sleep, irregular sleeping habits, stress, anxiety, new medications. Recommend limiting caffeine, getting regular exercise, doing breathing exercises and if having more mood issues, speaking to his PCP about anxiety. Thanks

## 2020-09-27 ENCOUNTER — Encounter: Payer: Self-pay | Admitting: Neurology

## 2020-09-27 ENCOUNTER — Ambulatory Visit (INDEPENDENT_AMBULATORY_CARE_PROVIDER_SITE_OTHER): Payer: 59 | Admitting: Neurology

## 2020-09-27 ENCOUNTER — Other Ambulatory Visit: Payer: Self-pay

## 2020-09-27 VITALS — BP 110/73 | HR 65 | Ht 67.0 in | Wt 168.0 lb

## 2020-09-27 DIAGNOSIS — R4189 Other symptoms and signs involving cognitive functions and awareness: Secondary | ICD-10-CM

## 2020-09-27 DIAGNOSIS — F4310 Post-traumatic stress disorder, unspecified: Secondary | ICD-10-CM | POA: Diagnosis not present

## 2020-09-27 DIAGNOSIS — F331 Major depressive disorder, recurrent, moderate: Secondary | ICD-10-CM

## 2020-09-27 DIAGNOSIS — F411 Generalized anxiety disorder: Secondary | ICD-10-CM

## 2020-09-27 NOTE — Patient Instructions (Signed)
Referral will be sent for psychiatry and psychotherapy  2. Continue with stress management techniques  3. Start using a pillbox with alarm to help with taking medications regularly  4. Follow-up as needed, call for any changes    RECOMMENDATIONS FOR ALL PATIENTS WITH MEMORY PROBLEMS: 1. Continue to exercise (Recommend 30 minutes of walking everyday, or 3 hours every week) 2. Increase social interactions - continue going to [place] and enjoy social gatherings with friends and family 3. Eat healthy, avoid fried foods and eat more fruits and vegetables 4. Maintain adequate blood pressure, blood sugar, and blood cholesterol level. Reducing the risk of stroke and cardiovascular disease also helps promoting better memory. 5. Avoid stressful situations. Live a simple life and avoid aggravations. Organize your time and prepare for the next day in anticipation. 6. Sleep well, avoid any interruptions of sleep and avoid any distractions in the bedroom that may interfere with adequate sleep quality 7. Avoid sugar, avoid sweets as there is a strong link between excessive sugar intake, diabetes, and cognitive impairment The Mediterranean diet has been shown to help patients reduce the risk of progressive memory disorders and reduces cardiovascular risk. This includes eating fish, eat fruits and green leafy vegetables, nuts like almonds and hazelnuts, walnuts, and also use olive oil. Avoid fast foods and fried foods as much as possible. Avoid sweets and sugar as sugar use has been linked to worsening of memory function.
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
## 2020-09-28 ENCOUNTER — Other Ambulatory Visit: Payer: Self-pay

## 2020-09-28 DIAGNOSIS — R4189 Other symptoms and signs involving cognitive functions and awareness: Secondary | ICD-10-CM

## 2020-09-28 DIAGNOSIS — F411 Generalized anxiety disorder: Secondary | ICD-10-CM

## 2020-10-01 ENCOUNTER — Encounter: Payer: Self-pay | Admitting: Psychology

## 2020-10-04 ENCOUNTER — Other Ambulatory Visit: Payer: Self-pay

## 2020-10-04 ENCOUNTER — Ambulatory Visit: Payer: Managed Care, Other (non HMO) | Admitting: Neurology

## 2020-10-04 DIAGNOSIS — F411 Generalized anxiety disorder: Secondary | ICD-10-CM

## 2020-10-04 DIAGNOSIS — R200 Anesthesia of skin: Secondary | ICD-10-CM

## 2020-10-14 ENCOUNTER — Other Ambulatory Visit: Payer: Self-pay

## 2020-10-14 NOTE — Progress Notes (Signed)
Cancelled the appt and placed referral to Behavioral health.

## 2021-01-20 ENCOUNTER — Ambulatory Visit: Payer: 59 | Admitting: Psychology

## 2021-02-08 ENCOUNTER — Telehealth: Payer: Self-pay

## 2021-02-08 ENCOUNTER — Other Ambulatory Visit: Payer: Self-pay

## 2021-02-08 ENCOUNTER — Ambulatory Visit
Admission: EM | Admit: 2021-02-08 | Discharge: 2021-02-08 | Disposition: A | Discharge status: Home / Self Care | Payer: 59 | Attending: Emergency Medicine | Admitting: Emergency Medicine

## 2021-02-08 DIAGNOSIS — U071 COVID-19: Secondary | ICD-10-CM

## 2021-02-08 MED ORDER — PROMETHAZINE-DM 6.25-15 MG/5ML PO SYRP: 5.0000 mL | ORAL_SOLUTION | Freq: Four times a day (QID) | ORAL | 0 refills | Status: DC | PRN

## 2021-02-08 MED ORDER — IPRATROPIUM BROMIDE 0.06 % NA SOLN: 2.0000 | Freq: Four times a day (QID) | NASAL | 0 refills | Status: DC

## 2021-02-08 NOTE — ED Provider Notes (Signed)
[facility]    CSN: [id number] Arrival date & time: 02/08/21  1000    HISTORY  No chief complaint on file.  HPI Spencer Byrd is a 50 y.o. male. Pt c/o feeling like he has covid, chills, states he was exposed to covid at a party 4 days ago. At home covid test was positive 2 days ago.  Patient states overall he feels okay however cough is keeping him awake at night and he has a significant amount of sinus drainage.  Denies headache, body ache, loss of taste or smell, nausea, vomiting, diarrhea, shortness of breath.  Patient has a slightly elevated temperature on arrival but oxygenation is normal.  The history is provided by the patient.  Past Medical History:  Diagnosis Date   Allergic rhinitis    Generalized anxiety disorder 04/12/2017   Hyperlipidemia 02/28/2014   Major depressive disorder    Obstructive sleep apnea    mild; on CPAP   PTSD (post-traumatic stress disorder)    Patient Active Problem List   Diagnosis Date Noted   Allergic rhinitis    PTSD (post-traumatic stress disorder)    Obstructive sleep apnea    Major depressive disorder    Generalized anxiety disorder 04/12/2017   Hyperlipidemia 02/28/2014   History reviewed. No pertinent surgical history.  Home Medications    Prior to Admission medications  - None   Family History Family History  Problem Relation Age of Onset   Cancer Mother    Social History Social History   Tobacco Use   Smoking status: Never   Smokeless tobacco: Never  Vaping Use   Vaping Use: Never used  Substance Use Topics   Alcohol use: No    Alcohol/week: 0.0 standard drinks    Comment: minimal/rare   Drug use: No   Allergies   Venlafaxine hcl  Review of Systems Review of Systems Pertinent findings noted in history of present illness.   Physical Exam Triage Vital Signs ED Triage Vitals  Enc Vitals Group     BP 12/17/20 0827 (!) 147/82     Pulse Rate 12/17/20 0827 72     Resp 12/17/20 0827 18     Temp  12/17/20 0827 98.3 F (36.8 C)     Temp Source 12/17/20 0827 Oral     SpO2 12/17/20 0827 98 %     Weight --      Height --      Head Circumference --      Peak Flow --      Pain Score 12/17/20 0826 5     Pain Loc --      Pain Edu? --      Excl. in [place]? --   No data found.  Updated Vital Signs BP 129/82 (BP Location: Left Arm)    Pulse 80    Temp 99.6 F (37.6 C) (Oral)    Resp 18    SpO2 97%   Physical Exam Vitals and nursing note reviewed.  Constitutional:      General: He is not in acute distress.    Appearance: Normal appearance. He is not ill-appearing.  HENT:     Head: Normocephalic and atraumatic.     Salivary Glands: Right salivary gland is not diffusely enlarged or tender. Left salivary gland is not diffusely enlarged or tender.     Right Ear: Tympanic membrane, ear canal and external ear normal. No drainage. No middle ear effusion. There is no impacted cerumen. Tympanic membrane is not erythematous  or bulging.     Left Ear: Tympanic membrane, ear canal and external ear normal. No drainage.  No middle ear effusion. There is no impacted cerumen. Tympanic membrane is not erythematous or bulging.     Nose: Nose normal. No nasal deformity, septal deviation, mucosal edema, congestion or rhinorrhea.     Right Turbinates: Not enlarged, swollen or pale.     Left Turbinates: Not enlarged, swollen or pale.     Right Sinus: No maxillary sinus tenderness or frontal sinus tenderness.     Left Sinus: No maxillary sinus tenderness or frontal sinus tenderness.     Mouth/Throat:     Lips: Pink. No lesions.     Mouth: Mucous membranes are moist. No oral lesions.     Pharynx: Oropharynx is clear. Uvula midline. No posterior oropharyngeal erythema or uvula swelling.     Tonsils: No tonsillar exudate. 0 on the right. 0 on the left.  Eyes:     General: Lids are normal.        Right eye: No discharge.        Left eye: No discharge.     Extraocular Movements: Extraocular movements intact.      Conjunctiva/sclera: Conjunctivae normal.     Right eye: Right conjunctiva is not injected.     Left eye: Left conjunctiva is not injected.  Neck:     Trachea: Trachea and phonation normal.  Cardiovascular:     Rate and Rhythm: Normal rate and regular rhythm.     Pulses: Normal pulses.     Heart sounds: Normal heart sounds. No murmur heard.   No friction rub. No gallop.  Pulmonary:     Effort: Pulmonary effort is normal. No accessory muscle usage, prolonged expiration or respiratory distress.     Breath sounds: Normal breath sounds. No stridor, decreased air movement or transmitted upper airway sounds. No decreased breath sounds, wheezing, rhonchi or rales.  Chest:     Chest wall: No tenderness.  Musculoskeletal:        General: Normal range of motion.     Cervical back: Normal range of motion and neck supple. Normal range of motion.  Lymphadenopathy:     Cervical: No cervical adenopathy.  Skin:    General: Skin is warm and dry.     Findings: No erythema or rash.  Neurological:     General: No focal deficit present.     Mental Status: He is alert and oriented to person, place, and time.  Psychiatric:        Mood and Affect: Mood normal.        Behavior: Behavior normal.    Visual Acuity Right Eye Distance:   Left Eye Distance:   Bilateral Distance:    Right Eye Near:   Left Eye Near:    Bilateral Near:     UC Couse / Diagnostics / Procedures:    EKG  Radiology No results found.  Procedures Procedures (including critical care time)  UC Diagnoses / Final Clinical Impressions(s)   I have reviewed the triage vital signs and the nursing notes.  Pertinent labs & imaging results that were available during my care of the patient were reviewed by me and considered in my medical decision making (see chart for details).   Final diagnoses:  [id number]   COVID-19, conservative care recommended, discussed sleeping prone, will provide cough medicine and Atrovent nasal spray  for nasal decongestion.  Return precautions advised.  Patient advised to go the emergency room with  acute shortness of breath, altered mental status, increased respirations.  Follow-up with PCP.  ED Prescriptions     Medication Sig Dispense Auth. Provider   promethazine-dextromethorphan (PROMETHAZINE-DM) 6.25-15 MG/5ML syrup Take 5 mLs by mouth 4 (four) times daily as needed for cough. 180 mL Lynden Oxford Scales, PA-C   ipratropium (ATROVENT) 0.06 % nasal spray Place 2 sprays into both nostrils 4 (four) times daily. As needed for nasal congestion, runny nose 15 mL Lynden Oxford Scales, PA-C      PDMP not reviewed this encounter.  Pending results:  Labs Reviewed - No data to display  Medications Ordered in UC: Medications - No data to display  Disposition Upon Discharge:  Condition: stable for discharge home Home: take medications as prescribed; routine discharge instructions as discussed; follow up as advised.  Patient presented with an acute illness with associated systemic symptoms and significant discomfort requiring urgent management. In my opinion, this is a condition that a prudent lay person (someone who possesses an average knowledge of health and medicine) may potentially expect to result in complications if not addressed urgently such as respiratory distress, impairment of bodily function or dysfunction of bodily organs.   Routine symptom specific, illness specific and/or disease specific instructions were discussed with the patient and/or caregiver at length.   As such, the patient has been evaluated and assessed, work-up was performed and treatment was provided in alignment with urgent care protocols and evidence based medicine.  Patient/parent/caregiver has been advised that the patient may require follow up for further testing and treatment if the symptoms continue in spite of treatment, as clinically indicated and appropriate.  If the patient was tested for COVID-19,  Influenza and/or RSV, then the patient/parent/guardian was advised to isolate at home pending the results of his/her diagnostic coronavirus test and potentially longer if theyre positive. I have also advised pt that if his/her COVID-19 test returns positive, it's recommended to self-isolate for at least 10 days after symptoms first appeared AND until fever-free for 24 hours without fever reducer AND other symptoms have improved or resolved. Discussed self-isolation recommendations as well as instructions for household member/close contacts as per the [facility] and [place] DHHS, and also gave patient the [place] packet with this information.  Patient/parent/caregiver has been advised to return to the [facility] or PCP in 3-5 days if no better; to PCP or the Emergency Department if new signs and symptoms develop, or if the current signs or symptoms continue to change or worsen for further workup, evaluation and treatment as clinically indicated and appropriate  The patient will follow up with their current PCP if and as advised. If the patient does not currently have a PCP we will assist them in obtaining one.   The patient may need specialty follow up if the symptoms continue, in spite of conservative treatment and management, for further workup, evaluation, consultation and treatment as clinically indicated and appropriate.  Patient/parent/caregiver verbalized understanding and agreement of plan as discussed.  All questions were addressed during visit.  Please see discharge instructions below for further details of plan.  Discharge Instructions:   Discharge Instructions      Please remain home from work, school, public places until February 14, 2021.  I provided you with a note to return to work.  Conservative care is recommended at this time.  This includes rest, pushing clear fluids and activity as tolerated.  You may also noticed that your appetite is reduced, this is okay as long as they are drinking plenty  of  clear fluids.  See the list below for medications that I recommend you take to relieve your symptoms:  Acetaminophen (Tylenol): This is a good fever reducer.  If there body temperature rises above 101.5 as measured with a thermometer, it is recommended that you give them 1,000 mg every 6-8 hours until they are temperature falls below 101.5, please not take more than 3,000 mg of acetaminophen either as a separate medication or as in ingredient in an over-the-counter cold/flu preparation within a 24-hour period.    Ibuprofen  (Advil, Motrin): This is a good anti-inflammatory medication which addresses aches and pains and, to some degree, congestion in the nasal passages.  I recommend giving between 400 to 600 mg every 6-8 hours as needed.    Promethazine DM: Promethazine is both the nasal decongestant and an antinausea medication that makes most patients feel fairly sleepy.  The DM is dextromethorphan, a cough suppressant found many over-the-counter cough medications.  Please take 5 mL before bedtime to help you sleep better, minimize your cough.  I have provided you with a prescription for this medication.    Ipratropium (Atrovent): This is an excellent nasal decongestant spray that does not cause rebound congestion, can be used up to 4 times daily as needed, instill 2 sprays into each nare with each use.  I have provided you with a prescription for this medication.    Based on my physical exam findings and the history provided  today, I do not see any evidence of bacterial infection therefore treatment with antibiotics would be of no benefit, I also do not believe that you would benefit from any steroid treatment.  If you begin to have acute shortness of breath, altered mental status please report to the emergency room soon as possible for evaluation of possible pneumonia secondary to COVID-19.  Please follow-up within the next 3 to 5 days either with your primary care provider or urgent care if your  symptoms do not resolve.  If you do not have a primary care provider, we will assist you in finding one.       This office note has been dictated using Museum/gallery curator.  Unfortunately, and despite my best efforts, this method of dictation can sometimes lead to occasional typographical or grammatical errors.  I apologize in advance if this occurs.    Lynden Oxford Scales, PA-C 02/08/21 1136

## 2021-02-08 NOTE — Discharge Instructions (Addendum)
Please remain home from work, school, public places until February 14, 2021.  I provided you with a note to return to work.  Conservative care is recommended at this time.  This includes rest, pushing clear fluids and activity as tolerated.  You may also noticed that your appetite is reduced, this is okay as long as they are drinking plenty of clear fluids.  See the list below for medications that I recommend you take to relieve your symptoms:  Acetaminophen (Tylenol): This is a good fever reducer.  If there body temperature rises above 101.5 as measured with a thermometer, it is recommended that you give them 1,000 mg every 6-8 hours until they are temperature falls below 101.5, please not take more than 3,000 mg of acetaminophen either as a separate medication or as in ingredient in an over-the-counter cold/flu preparation within a 24-hour period.    Ibuprofen  (Advil, Motrin): This is a good anti-inflammatory medication which addresses aches and pains and, to some degree, congestion in the nasal passages.  I recommend giving between 400 to 600 mg every 6-8 hours as needed.    Promethazine DM: Promethazine is both the nasal decongestant and an antinausea medication that makes most patients feel fairly sleepy.  The DM is dextromethorphan, a cough suppressant found many over-the-counter cough medications.  Please take 5 mL before bedtime to help you sleep better, minimize your cough.  I have provided you with a prescription for this medication.    Ipratropium (Atrovent): This is an excellent nasal decongestant spray that does not cause rebound congestion, can be used up to 4 times daily as needed, instill 2 sprays into each nare with each use.  I have provided you with a prescription for this medication.    Based on my physical exam findings and the history provided  today, I do not see any evidence of bacterial infection therefore treatment with antibiotics would be of no benefit, I also do not  believe that you would benefit from any steroid treatment.  If you begin to have acute shortness of breath, altered mental status please report to the emergency room soon as possible for evaluation of possible pneumonia secondary to COVID-19.  Please follow-up within the next 3 to 5 days either with your primary care provider or urgent care if your symptoms do not resolve.  If you do not have a primary care provider, we will assist you in finding one.

## 2021-02-08 NOTE — ED Triage Notes (Signed)
Pt c/o feeling like he has covid, chills, he was exposed to covid. At home covid test was positive. Started: 3 days ago

## 2021-02-22 ENCOUNTER — Encounter: Payer: 59 | Admitting: Internal Medicine

## 2021-02-24 ENCOUNTER — Ambulatory Visit (INDEPENDENT_AMBULATORY_CARE_PROVIDER_SITE_OTHER): Payer: 59 | Admitting: Internal Medicine

## 2021-02-24 ENCOUNTER — Encounter: Payer: Self-pay | Admitting: Internal Medicine

## 2021-02-24 VITALS — BP 117/72 | HR 59 | Temp 98.2°F | Wt 165.5 lb

## 2021-02-24 DIAGNOSIS — E785 Hyperlipidemia, unspecified: Secondary | ICD-10-CM

## 2021-02-24 DIAGNOSIS — G4733 Obstructive sleep apnea (adult) (pediatric): Secondary | ICD-10-CM

## 2021-02-24 DIAGNOSIS — Z0000 Encounter for general adult medical examination without abnormal findings: Secondary | ICD-10-CM | POA: Insufficient documentation

## 2021-02-24 DIAGNOSIS — F411 Generalized anxiety disorder: Secondary | ICD-10-CM

## 2021-02-24 DIAGNOSIS — F331 Major depressive disorder, recurrent, moderate: Secondary | ICD-10-CM | POA: Diagnosis not present

## 2021-02-24 MED ORDER — CITALOPRAM HYDROBROMIDE 10 MG PO TABS: 10.0000 mg | ORAL_TABLET | Freq: Every day | ORAL | 2 refills | Status: DC

## 2021-02-24 MED ORDER — ATORVASTATIN CALCIUM 40 MG PO TABS: 40.0000 mg | ORAL_TABLET | Freq: Every day | ORAL | 2 refills | Status: DC

## 2021-02-24 NOTE — Assessment & Plan Note (Addendum)
Patient has a history of GAD and MDD.  Reports previously prescribed Zoloft, though only took this medication for 1 week before discontinuing.  Reason for discontinuing was that he was feeling jittery on the medication, endorsed it made him feel "weird".  Reports taking Zoloft as needed for anxiety which may also help with symptoms of depression.  Discussed with patient that these antidepressants need to be taken for several weeks to months before patient sees benefit and cannot be used as needed.  Patient endorses anxiety began after incarceration.  Patient has been incarcerated for a total of 20 years, has been incarcerated many times for courses of 1 to 8 years.  Last incarceration was 8 years ago.  Since then he has been working at [employer], lives with his wife and his 79 year old daughter and is doing well.  He reports he is anxious in general, reports anxiety is related to prior history of incarceration.  Endorses restless, moving around a lot at work, racing thoughts, intermittent insomnia.  PHQ-9 6 and GAD- 711.  This is consistent with moderate generalized anxiety disorder.  Patient would benefit from restarting a medication for anxiety and depression.  Patient is also open to speaking with our psychologist, Dr. Theodis Shove.  Reviewed possible side effect of nausea for the first few days after starting citalopram.  Discussed that this antidepressant is less likely to cause activating symptoms/jitteriness.  Plan: -Referral to integrated behavioral health -Start citalopram 10 mg once daily -Follow-up in 1 month

## 2021-02-24 NOTE — Assessment & Plan Note (Addendum)
Patient reports history of high cholesterol.  Per chart review patient has history of hyperlipidemia and was previously prescribed Lipitor 40 mg daily.  Patient endorses adherence to Lipitor.  Most recent lipid panel per chart review completed in 2016 with cholesterol 397, triglycerides 74, HDL 42, LDL 250.  We will continue Lipitor 40 mg daily, and order repeat lipid panel.  Plan: -Continue Lipitor 40 mg daily -Lipid panel today  ADDENDUM:  Results of lipid panel total cholesterol 273, triglycerides 85, HDL 50, LDL 209.  LDL currently not at goal >190.  ASCVD 10-year risk of cardiovascular event 5.2%. High intensity statin recommended LDL >190.  We will continue Lipitor 40 mg daily.

## 2021-02-24 NOTE — Assessment & Plan Note (Addendum)
Per chart review, patient had a colonoscopy on 11/12/2019, however we are not able to see the colonoscopy report.  Patient signed release form today for [facility] to obtain colonoscopy records.  He will find out where colonoscopy was done and obtain records for [place].    Today we also discussed the Shingrix vaccine.  Patient will consider.  Patient will need to obtain Shingrix vaccine at any pharmacy he chooses, since we do not offer vaccine at [facility].

## 2021-02-24 NOTE — Patient Instructions (Addendum)
Thank you, [name] for allowing [place] to provide your care today. Today we discussed:  Depression/anxiety: We will start you on a medication called citalopram (Celexa).  You will take Celexa 10 mg once a day every day.  This medication can take a while before you start to feel the benefits.  Some patients have mild nausea when they first start the medicine, but this goes away after a few days. I will put in a referral to our psychologist, Dr. Theodis Shove.  You will receive a call to set up an appointment with her.  High cholesterol: I will call in a prescription for Lipitor 40 mg once daily for high cholesterol.  We will also get a lipid panel today which looks at the fats in your blood and your cholesterol.  I have ordered the following labs for you:   Lab Orders         Lipid Profile       My Chart Access: [email or web address]  Please follow-up in 1 month.  Please make sure to arrive 15 minutes prior to your next appointment. If you arrive late, you may be asked to reschedule.    We look forward to seeing you next time. Please call our clinic at [phone] if you have any questions or concerns. The best time to call is Monday-Friday from 9am-4pm, but there is someone available 24/7. If after hours or the weekend, call the main hospital number and ask for the Internal Medicine Resident On-Call. If you need medication refills, please notify your pharmacy one week in advance and they will send [place] a request.   Thank you for letting us take part in your care. Wishing you the best!  Wayland Denis, MD 02/24/2021, 4:12 PM IM Resident, PGY-1

## 2021-02-24 NOTE — Assessment & Plan Note (Signed)
Patient has sleep study performed in 2019.  Has a CPAP at home that he uses most nights.  He endorses improvement in energy after remembering to use CPAP.  He has no problems with CPAP use.  Plan: -Continue CPAP nightly

## 2021-02-24 NOTE — Progress Notes (Signed)
°  CC: Establish care  HPI:  [name] is a 51 y.o. male with a past medical history stated below and presents today to establish care with PCP. Please see problem based assessment and plan for additional details.  Past Medical History:  Diagnosis Date   Allergic rhinitis    Generalized anxiety disorder 04/12/2017   Hyperlipidemia 02/28/2014   Major depressive disorder    Obstructive sleep apnea    mild; on CPAP   PTSD (post-traumatic stress disorder)    Medications: Currently not taking any medications.   Family History  Problem Relation Age of Onset   Leukemia Mother    Hypertension Sister     Social History:  Lives in [place], lives with wife and daughter (age 9). Works at [employer]. Social History   Tobacco Use   Smoking status: Never   Smokeless tobacco: Never  Vaping Use   Vaping Use: Never used  Substance Use Topics   Alcohol use: No    Alcohol/week: 0.0 standard drinks    Comment: minimal/rare   Drug use: Yes    Types: Marijuana    Comment: Remote history    Review of Systems: ROS negative except for what is noted on the assessment and plan.  Vitals:   02/24/21 1437  BP: 117/72  Pulse: (!) 59  Temp: 98.2 F (36.8 C)  TempSrc: Oral  SpO2: 99%  Weight: 165 lb 8 oz (75.1 kg)    Physical Exam: General: Well appearing African-American gentleman, appears younger than stated age, NAD HENT: normocephalic, atraumatic EYES: conjunctiva non-erythematous, no scleral icterus CV: Bradycardic, normal rhythm, no murmurs, rubs, gallops. Pulmonary: normal work of breathing on RA, lungs clear to auscultation, no rales, wheezes, rhonchi Abdominal: non-distended, soft, non-tender to palpation, normal BS Skin: Warm and dry, no rashes or lesions Neurological: MS: awake, alert and oriented x3, normal speech and fund of knowledge Motor: moves all extremities antigravity Psych: normal affect    Assessment & Plan:   See Encounters Tab for  problem based charting.  Patient discussed with Dr.  Solon Augusta, M.D. [facility], PGY-1 Pager: [phone] Date 02/24/2021 Time 4:27 PM

## 2021-02-25 LAB — LIPID PANEL
Chol/HDL Ratio: 5.5 ratio — ABNORMAL HIGH (ref 0.0–5.0)
Cholesterol, Total: 273 mg/dL — ABNORMAL HIGH (ref 100–199)
HDL: 50 mg/dL (ref >39)
LDL Chol Calc (NIH): 209 mg/dL — ABNORMAL HIGH (ref 0–99)
Triglycerides: 85 mg/dL (ref 0–149)
VLDL Cholesterol Cal: 14 mg/dL (ref 5–40)

## 2021-03-01 NOTE — Progress Notes (Signed)
Internal Medicine Clinic Attending ? ?Case discussed with Dr. Zinoviev  At the time of the visit.  We reviewed the resident?s history and exam and pertinent patient test results.  I agree with the assessment, diagnosis, and plan of care documented in the resident?s note.  ?

## 2021-03-03 ENCOUNTER — Encounter: Payer: Self-pay | Admitting: Internal Medicine

## 2021-03-17 ENCOUNTER — Encounter: Payer: Self-pay | Admitting: Internal Medicine

## 2021-03-17 ENCOUNTER — Ambulatory Visit: Payer: 59 | Admitting: Internal Medicine

## 2021-03-17 ENCOUNTER — Institutional Professional Consult (permissible substitution): Payer: 59 | Admitting: Behavioral Health

## 2021-03-17 DIAGNOSIS — Z0000 Encounter for general adult medical examination without abnormal findings: Secondary | ICD-10-CM

## 2021-03-17 DIAGNOSIS — J309 Allergic rhinitis, unspecified: Secondary | ICD-10-CM

## 2021-03-17 DIAGNOSIS — F411 Generalized anxiety disorder: Secondary | ICD-10-CM

## 2021-03-17 MED ORDER — FLUTICASONE PROPIONATE 50 MCG/ACT NA SUSP: 1.0000 | Freq: Every day | NASAL | 2 refills | Status: DC

## 2021-03-17 NOTE — Assessment & Plan Note (Signed)
Our office staff will work on obtaining colonoscopy report from outside facility, for colonoscopy done on 11/12/2019.  Patient has already signed release form to obtain results.  He cannot recall the office or physician with whom he completed the colonoscopy with.  Reports had flu shot back in November at his previous doctor's office.  Have previously discussed getting Shingrix vaccine at a local pharmacy and patient will consider.

## 2021-03-17 NOTE — Progress Notes (Signed)
°  CC: GAD follow up  HPI:  [name] is a 51 y.o. male with a past medical history stated below and presents today for GAD follow-up. Please see problem based assessment and plan for additional details.  Past Medical History:  Diagnosis Date   Allergic rhinitis    Generalized anxiety disorder 04/12/2017   Hyperlipidemia 02/28/2014   Major depressive disorder    Obstructive sleep apnea    mild; on CPAP   PTSD (post-traumatic stress disorder)     Current Outpatient Medications on File Prior to Visit  Medication Sig Dispense Refill   atorvastatin (LIPITOR) 40 MG tablet Take 1 tablet (40 mg total) by mouth daily. 90 tablet 2   citalopram (CELEXA) 10 MG tablet Take 1 tablet (10 mg total) by mouth daily. 30 tablet 2   No current facility-administered medications on file prior to visit.    Family History  Problem Relation Age of Onset   Leukemia Mother    Hypertension Sister     Social History: Lives in [place], lives with wife and daughter (age 32). Works at [employer]. Patient has been incarcerated for a total of 20 years, has been incarcerated many times for courses of 1 to 8 years.  Last incarceration was 8 years ago.  Review of Systems: ROS negative except for what is noted on the assessment and plan.  Vitals:   03/17/21 1419  BP: 110/64  Pulse: (!) 58  Temp: 98 F (36.7 C)  TempSrc: Oral  SpO2: 99%  Weight: 167 lb 4.8 oz (75.9 kg)     Physical Exam: General: Well appearing African-American male, NAD HENT: normocephalic, atraumatic, no rhinorrhea EYES: conjunctiva non-erythematous, no scleral icterus CV: regular rate, normal rhythm, no murmurs, rubs, gallops.  No lower extremity edema. Pulmonary: normal work of breathing on RA, lungs clear to auscultation, no rales, wheezes, rhonchi Abdominal: non-distended, soft, non-tender to palpation, normal BS Skin: Warm and dry, no rashes or lesions Neurological: MS: awake, alert and oriented x3, normal  speech and fund of knowledge Motor: moves all extremities antigravity Psych: normal affect    Assessment & Plan:   See Encounters Tab for problem based charting.  Patient discussed with Dr. Jilda Panda, M.D. [facility], PGY-1 Pager: [phone] Date 03/17/2021 Time 3:01 PM

## 2021-03-17 NOTE — Progress Notes (Signed)
Internal Medicine Clinic Attending ? ?Case discussed with Dr. Zinoviev  At the time of the visit.  We reviewed the resident?s history and exam and pertinent patient test results.  I agree with the assessment, diagnosis, and plan of care documented in the resident?s note.  ?

## 2021-03-17 NOTE — Patient Instructions (Signed)
Thank you, Mr.Sherril Adonte Vanriper for allowing [place] to provide your care today. Today we discussed:  Anxiety: On glad to hear that you are starting to feel better on Celexa.  We will keep you at Celexa 10 mg.  Please take the medication daily in the evening since this is causing some drowsiness.  I am glad to hear you are able to reschedule your telehealth appointment.   Allergy symptoms: I have ordered the Flonase and [facility] [address].  For your other allergy symptoms I would recommend over-the-counter Zyrtec, Allegra or Claritin.  We will work on our end to obtain your colonoscopy report.   My Chart Access: [email or web address]  Please follow-up in 6 months.  Please make sure to arrive 15 minutes prior to your next appointment. If you arrive late, you may be asked to reschedule.    We look forward to seeing you next time. Please call our clinic at [phone] if you have any questions or concerns. The best time to call is Monday-Friday from 9am-4pm, but there is someone available 24/7. If after hours or the weekend, call the main hospital number and ask for the Internal Medicine Resident On-Call. If you need medication refills, please notify your pharmacy one week in advance and they will send [place] a request.   Thank you for letting us take part in your care. Wishing you the best!  Wayland Denis, MD 03/17/2021, 2:54 PM IM Resident, PGY-1

## 2021-03-17 NOTE — Addendum Note (Signed)
Addended by: Wayland Denis on: 03/17/2021 04:10 PM   Modules accepted: Level of Service

## 2021-03-17 NOTE — Assessment & Plan Note (Addendum)
Patient presents for OV for GAD follow-up.  Was prescribed Celexa 10 mg at last OV 02/24/2021.  Patient reports some improvement in symptoms since last OV.  Reports worrying less during the day, less racing thoughts.  PHQ-9 score of 6 and GAD-7 score of 4 today.  Patient does endorse side effect of drowsiness and requesting to take the medication at night.  Discussed that it is safe to take the medication nightly.  Denies other side effects.  Denies SI.  Patient had telehealth visit appointment scheduled for today with Dr. Carolynne Edouard but did not realize that this was telehealth and therefore we have rescheduled the appointment.  Discussed possibility of increasing the dose, patient reports he feels he is doing well on current dose and is comfortable with keeping it the same for now.  On assessment, patient has had improvement in GAD symptoms after starting Celexa, is comfortable on this dose, has had some mild drowsiness side effects for which we will switch to nightly dosing.  Given he is doing well on this dose we will keep the dosage at 10 mg for now.  Plan: -Continue Celexa 10 mg nightly -Continue telehealth meetings with Dr. Theodis Shove -Follow-up in 6 months -Can consider increasing dose if needed at next OV

## 2021-03-17 NOTE — Assessment & Plan Note (Signed)
Patient reports has history of allergic rhinitis for which she has previously been prescribed Flonase.  Is having allergic rhinitis symptoms as well as other allergy related symptoms such as itchy throat and watery eyes which occur intermittently for the past several months.  Denies itchy red eyes. Is requesting refill of Flonase today.  On assessment, patient has had symptoms of allergic rhinitis daily, will represcribe Flonase.  Also discussed second-generation antihistamine use for other allergy symptoms.  Plan: -Flonase 50 mg 1 spray bilateral nares daily -OTC Zyrtec, Allegra, Claritin as needed

## 2021-03-18 ENCOUNTER — Telehealth: Payer: Self-pay | Admitting: Behavioral Health

## 2021-03-18 NOTE — Telephone Encounter (Signed)
Pt is currently @ an with one of his Providers. Pt requests appt be r/s'd to 04/12/21 @ 2:30pm. We will attempt to accommodate this request.   Dr. Theodis Shove

## 2021-03-19 ENCOUNTER — Other Ambulatory Visit: Payer: Self-pay | Admitting: Internal Medicine

## 2021-03-22 ENCOUNTER — Encounter: Payer: 59 | Admitting: Internal Medicine

## 2021-03-29 ENCOUNTER — Ambulatory Visit: Payer: 59 | Admitting: Psychology

## 2021-04-12 ENCOUNTER — Institutional Professional Consult (permissible substitution): Payer: 59 | Admitting: Behavioral Health

## 2021-05-09 ENCOUNTER — Ambulatory Visit: Payer: 59 | Admitting: Behavioral Health

## 2021-05-09 DIAGNOSIS — F331 Major depressive disorder, recurrent, moderate: Secondary | ICD-10-CM

## 2021-05-09 DIAGNOSIS — F419 Anxiety disorder, unspecified: Secondary | ICD-10-CM

## 2021-05-09 NOTE — BH Specialist Note (Signed)
Integrated Behavioral Health via Telemedicine Visit ? ?05/09/2021 ?Mando Suhail Peloquin ?[id number] ? ?Number of [facility] Clinician visits: 2 ?Session Start time: 1400 ?Session End time: 1430 ?Total time in minutes: 30 min ? ?Referring Provider: Dr. Lily Kocher, MD ?Patient/Family location: Pt is taking a break @ work ?[facility] Provider location: [facility] Office ?All persons participating in visit: Pt & Clinician ?Types of Service: Individual psychotherapy ? ?I connected with Roben Marvel Plan and/or Taiga Kathyrn Lass  self  via  Telephone or Geologist, engineering  (Video is Tree surgeon) and verified that I am speaking with the correct person using two identifiers. Discussed confidentiality: Yes  ? ?I discussed the limitations of telemedicine and the availability of in person appointments.  Discussed there is a possibility of technology failure and discussed alternative modes of communication if that failure occurs. ? ?I discussed that engaging in this telemedicine visit, they consent to the provision of behavioral healthcare and the services will be billed under their insurance. ? ?Patient and/or legal guardian expressed understanding and consented to Telemedicine visit: Yes  ? ?Presenting Concerns: ?Patient and/or family reports the following symptoms/concerns: reduction in anx/dep since he has made up his mind to make his relationship w/his Wife [name] the priority. ?Duration of problem: months; Severity of problem: mild ? ?Patient and/or Family's Strengths/Protective Factors: ?Social and Emotional competence, Concrete supports in place (healthy food, safe environments, etc.), Sense of purpose, and Physical Health (exercise, healthy diet, medication compliance, etc.) ? ?Goals Addressed: ?Patient will: ? Reduce symptoms of: anxiety, depression, and stress  ? Increase knowledge and/or ability of: coping skills, stress reduction, and relational issues   ? Demonstrate ability to:  Increase healthy adjustment to current life circumstances ? ?Progress towards Goals: ?Ongoing ? ?Interventions: ?Interventions utilized:  Solution-Focused Strategies and Supportive Counseling ?Standardized Assessments completed:  screeners prn ? ?Patient and/or Family Response: Pt receptive to call today, calling [facility] back after missed appt call. Pt requests future appts. ? ?Assessment: ?Patient currently experiencing healthy reduction in his level of worry & stress over his relationship w/his Wife. She is struggling & he wants to do all he can to provide her w/love & security. ? ?Pt is working on staying positive, he is working out physically, & he is inc'g his time & attn given to his Wife. ? ?Patient may benefit from cont'd Cpl & Indiv Cslg sessions. ? ?Plan: ?Follow up with behavioral health clinician on : 3 wks @ 3:00pm for 30 min telehealth ?Behavioral recommendations: Try some of the thoughtful suggestions offered today & see how this impacts your Wife's perception of the attn you give her & how you value her utmost. ?Referral(s): [place] (In Clinic) ? ?I discussed the assessment and treatment plan with the patient and/or parent/guardian. They were provided an opportunity to ask questions and all were answered. They agreed with the plan and demonstrated an understanding of the instructions. ?  ?They were advised to call back or seek an in-person evaluation if the symptoms worsen or if the condition fails to improve as anticipated. ? ?Donnetta Hutching, LMFT ?

## 2021-06-08 ENCOUNTER — Telehealth: Payer: Self-pay | Admitting: Behavioral Health

## 2021-06-08 ENCOUNTER — Ambulatory Visit: Payer: 59 | Admitting: Behavioral Health

## 2021-06-08 NOTE — Telephone Encounter (Signed)
Unsuccessful attempts to reach Pt. Lft msg both times. Pt RC & asked for another attempt. Lft msg for Pt re: Clinician's schedule the rest of the afternoon. Clinician instructed Pt to [facility] to [facility] & r/s @ his convenience.  ? ?Dr. Theodis Shove ?

## 2021-06-17 ENCOUNTER — Other Ambulatory Visit: Payer: Self-pay | Admitting: Internal Medicine

## 2021-07-11 ENCOUNTER — Ambulatory Visit: Payer: 59 | Admitting: Behavioral Health

## 2021-07-11 DIAGNOSIS — F331 Major depressive disorder, recurrent, moderate: Secondary | ICD-10-CM

## 2021-07-11 DIAGNOSIS — F419 Anxiety disorder, unspecified: Secondary | ICD-10-CM

## 2021-07-11 NOTE — BH Specialist Note (Signed)
Integrated Behavioral Health via Telemedicine Visit  07/11/2021 Spencer Byrd [id number]  Number of [facility] Clinician visits: 3 Session Start time: 1500 Session End time: 7858 Total time in minutes: 30 min  Referring Provider: Dr. Lily Kocher, MD Patient/Family location: Pt is home from work in private [facility] Provider location: [facility] Office All persons participating in visit: Pt & Clinician Types of Service: Individual psychotherapy  I connected with Spencer Byrd and/or Spencer Byrd  self  via  Telephone or Geologist, engineering  (Video is Tree surgeon) and verified that I am speaking with the correct person using two identifiers. Discussed confidentiality: Yes   I discussed the limitations of telemedicine and the availability of in person appointments.  Discussed there is a possibility of technology failure and discussed alternative modes of communication if that failure occurs.  I discussed that engaging in this telemedicine visit, they consent to the provision of behavioral healthcare and the services will be billed under their insurance.  Patient and/or legal guardian expressed understanding and consented to Telemedicine visit: Yes   Presenting Concerns: Patient and/or family reports the following symptoms/concerns: reduction in anx/dep due to greater health of his relationship w/his Wife Spencer Byrd Duration of problem: months; Severity of problem: mild  Patient and/or Family's Strengths/Protective Factors: Social connections, Social and Emotional competence, Concrete supports in place (healthy food, safe environments, etc.), and Sense of purpose  Goals Addressed: Patient will:  Reduce symptoms of: anxiety, depression, and stress   Increase knowledge and/or ability of: coping skills, healthy habits, and stress reduction   Demonstrate ability to: Increase healthy adjustment to current life circumstances  Progress towards  Goals: Ongoing  Interventions: Interventions utilized:  Supportive Counseling Standardized Assessments completed:  screeners prn  Patient and/or Family Response: Pt receptive to call today  Assessment: Patient currently experiencing reduction in anx/dep due to changes in his efforts, thinking about Family first prior to making decisions.   Patient may benefit from cont'd ck-in call.  Byrd: Follow up with behavioral health clinician on : Next avail 30 min telehealth visit Behavioral recommendations: None today Referral(s): [place] (In Clinic)  I discussed the assessment and treatment Byrd with the patient and/or parent/guardian. They were provided an opportunity to ask questions and all were answered. They agreed with the Byrd and demonstrated an understanding of the instructions.   They were advised to call back or seek an in-person evaluation if the symptoms worsen or if the condition fails to improve as anticipated.  Donnetta Hutching, LMFT

## 2021-08-04 ENCOUNTER — Telehealth: Payer: Self-pay | Admitting: Behavioral Health

## 2021-08-04 ENCOUNTER — Ambulatory Visit: Payer: 59 | Admitting: Behavioral Health

## 2021-08-04 NOTE — Telephone Encounter (Signed)
Lft msg for Pt re: today's appt. Pt can RC to [facility] @ his convenience.  Dr. Theodis Shove

## 2021-08-14 ENCOUNTER — Encounter: Payer: Self-pay | Admitting: *Deleted

## 2021-11-07 ENCOUNTER — Encounter: Payer: Self-pay | Admitting: Student

## 2021-11-08 NOTE — Telephone Encounter (Signed)
Call to [atient to reschedule appointment that was missed.  Message left to call the Clinics to reschedule appointment.

## 2021-11-16 ENCOUNTER — Encounter: Payer: Self-pay | Admitting: Family Medicine

## 2021-11-16 ENCOUNTER — Ambulatory Visit (INDEPENDENT_AMBULATORY_CARE_PROVIDER_SITE_OTHER): Payer: 59 | Admitting: Family Medicine

## 2021-11-16 VITALS — BP 118/66 | HR 65 | Wt 163.3 lb

## 2021-11-16 DIAGNOSIS — Z23 Encounter for immunization: Secondary | ICD-10-CM

## 2021-11-16 DIAGNOSIS — R222 Localized swelling, mass and lump, trunk: Secondary | ICD-10-CM

## 2021-11-16 DIAGNOSIS — E785 Hyperlipidemia, unspecified: Secondary | ICD-10-CM

## 2021-11-16 DIAGNOSIS — F411 Generalized anxiety disorder: Secondary | ICD-10-CM

## 2021-11-16 DIAGNOSIS — G4733 Obstructive sleep apnea (adult) (pediatric): Secondary | ICD-10-CM | POA: Diagnosis not present

## 2021-11-16 DIAGNOSIS — J309 Allergic rhinitis, unspecified: Secondary | ICD-10-CM | POA: Diagnosis not present

## 2021-11-16 DIAGNOSIS — Z0000 Encounter for general adult medical examination without abnormal findings: Secondary | ICD-10-CM | POA: Insufficient documentation

## 2021-11-16 DIAGNOSIS — Z760 Encounter for issue of repeat prescription: Secondary | ICD-10-CM | POA: Insufficient documentation

## 2021-11-16 MED ORDER — FLUTICASONE PROPIONATE 50 MCG/ACT NA SUSP: 1.0000 | Freq: Every day | NASAL | 4 refills | Status: DC

## 2021-11-16 MED ORDER — CITALOPRAM HYDROBROMIDE 10 MG PO TABS: 10.0000 mg | ORAL_TABLET | Freq: Every day | ORAL | 3 refills | Status: DC

## 2021-11-16 MED ORDER — ATORVASTATIN CALCIUM 40 MG PO TABS: 40.0000 mg | ORAL_TABLET | Freq: Every day | ORAL | 3 refills | Status: DC

## 2021-11-16 NOTE — Progress Notes (Signed)
CC: Annual physical   HPI: Patient presented to clinic for his annual physical exam.  [name] is a 51 y.o. with history of hyperlipidemia, generalized anxiety disorder, PTSD, obstructive sleep apnea, allergic rhinitis presented to clinic for annual physical exam.  Patient is well built, appropriate mood and affect.  Patient is taking citalopram daily as prescribed until he ran out the medicine 2 weeks ago. The medication is keeping his symptoms well controlled. Pt denies being depressed. Denies changes in sleep, appetite or weight. Denies SI/HI. Pt lives with wife and have a full time job. Patient admitted he is not taking atorvastatin for a while.  Patient complaining of nodule over left upper back for couple years.  It gets painful time to time making his left arm stiff, predominantly after heavy workout.  The nodule has not increased in size.  Patient denies skin changes over the nodule.  Patient denies redness, itching at the site of the nodule. Patient denies weakness or paresthesia in the left arm.  Patient also reported left foot pain for few months.  Patient saw podiatrist last week and got injection in the sole of the left foot.  Patient reported his pain has improved after the injection.  Vitals stable. No acute visible distress.  Past Medical History:  Diagnosis Date   Allergic rhinitis    Generalized anxiety disorder 04/12/2017   Hyperlipidemia 02/28/2014   Major depressive disorder    Obstructive sleep apnea    mild; on CPAP   PTSD (post-traumatic stress disorder)    Review of Systems:Review of Systems  Musculoskeletal:  Positive for back pain (Painful nodule over left upper back).  Skin:  Negative for rash.  Psychiatric/Behavioral: Negative.  Negative for depression, substance abuse and suicidal ideas.   All other systems reviewed and are negative.     Physical Exam:Physical Exam Vitals and nursing note reviewed.  Constitutional:      Appearance: Normal  appearance.  HENT:     Head: Normocephalic and atraumatic.     Nose: Congestion (Inflammed nasal mucosa with boggy nasal turbinates BL.) present.     Mouth/Throat:     Mouth: Mucous membranes are moist.     Pharynx: Oropharynx is clear.  Eyes:     Conjunctiva/sclera: Conjunctivae normal.     Pupils: Pupils are equal, round, and reactive to light.  Cardiovascular:     Rate and Rhythm: Normal rate and regular rhythm.     Pulses: Normal pulses.     Heart sounds: Normal heart sounds.  Pulmonary:     Effort: Pulmonary effort is normal. No respiratory distress.     Breath sounds: Normal breath sounds. No wheezing or rales.  Abdominal:     General: There is no distension.     Palpations: Abdomen is soft.     Tenderness: There is no abdominal tenderness. There is no right CVA tenderness, left CVA tenderness or guarding.  Musculoskeletal:        General: Normal range of motion.     Cervical back: Normal range of motion and neck supple.       Back:     Comments: About 2.5 cm soft, mobile, TTP nodule over left trapezius area. No fluctuation, no cyst like appearance to palpation. No overlying redness. Skin cool to touch and normal in color.  Skin:    Findings: Lesion (Painful nodule over left upper back) present. No bruising, erythema or rash.  Neurological:     Mental Status: He is alert and  oriented to person, place, and time. Mental status is at baseline.  Psychiatric:        Mood and Affect: Mood normal.        Behavior: Behavior normal.      Vitals:   11/16/21 1449  BP: 118/66  Pulse: 65  SpO2: 99%  Weight: 163 lb 4.8 oz (74.1 kg)     Assessment & Plan:   See Encounters Tab for problem based charting.  Patient seen with Dr. Daryll Drown

## 2021-11-16 NOTE — Patient Instructions (Addendum)
Your cholesterol including LDL was high in January 2023.  Take your cholesterol medication daily as prescribed.   All the prescriptions  Ultrasound of the left upper back is ordered.  We will contact you with the results as soon as they become available. You may take Tylenol or ibuprofen as needed for pain. Follow-up in 1 year.

## 2021-11-16 NOTE — Assessment & Plan Note (Signed)
Refills for all the medications were sent to the pharmacy on file.

## 2021-11-16 NOTE — Assessment & Plan Note (Signed)
I have reviewed the LDL and cholesterol from January 2023.  His LDL and cholesterol readings were 209 and 273 respectively.  Patient admitted he is not taking his cholesterol medication.  Patient was educated on the adverse effects of cholesterol being high.  Patient was encouraged to take the medication regularly.  Patient agrees with the plan of care. Refill is sent to the pharmacy on file.

## 2021-11-16 NOTE — Assessment & Plan Note (Signed)
There is a painful, soft, mobile nodule over left upper back.  I have low threshold for abscess or infected cyst. Ultrasound order was placed.  Patient will be contacted with the results as soon as they become available.

## 2021-11-16 NOTE — Assessment & Plan Note (Signed)
Patient scored high on PHQ-9 scale.  Detailed discussion was made with the patient.  Patient stated he ran out of citalopram for 2 weeks and her symptoms bounced back for about a week ago.  Patient denies being depressed and he denied suicidal or homicidal ideation.  Education and counseling was done and encouraged to start the medication today as soon as he picks it up from the pharmacy.  Patient agrees with the plan.

## 2021-11-16 NOTE — Assessment & Plan Note (Signed)
Patient denies increased sleepiness, waking up tired in the morning or getting tired during the daytime.  He is using CPAP every night.  Patient was encouraged to continue using the CPAP.

## 2021-11-16 NOTE — Assessment & Plan Note (Signed)
Continue to use nasal spray. Highly recommend to use in on regular basis.

## 2021-11-17 NOTE — Progress Notes (Signed)
Internal Medicine Clinic Attending  Case discussed with Dr. Jeanett Schlein  at the time of the visit.  We reviewed the resident's history and exam and pertinent patient test results.  I agree with the assessment, diagnosis, and plan of care documented in the resident's note.

## 2021-12-09 ENCOUNTER — Ambulatory Visit (HOSPITAL_COMMUNITY)
Admission: RE | Admit: 2021-12-09 | Discharge: 2021-12-09 | Disposition: A | Discharge status: Home / Self Care | Payer: 59 | Source: Ambulatory Visit | Attending: Internal Medicine | Admitting: Internal Medicine

## 2021-12-09 DIAGNOSIS — R222 Localized swelling, mass and lump, trunk: Secondary | ICD-10-CM | POA: Diagnosis present

## 2021-12-12 ENCOUNTER — Telehealth: Payer: Self-pay

## 2021-12-12 ENCOUNTER — Encounter: Payer: Self-pay | Admitting: Student

## 2021-12-12 DIAGNOSIS — R222 Localized swelling, mass and lump, trunk: Secondary | ICD-10-CM

## 2021-12-12 DIAGNOSIS — D1779 Benign lipomatous neoplasm of other sites: Secondary | ICD-10-CM

## 2021-12-12 NOTE — Telephone Encounter (Signed)
Pt is requesting a call back he is wanting to know his results from his ultrasound

## 2021-12-13 ENCOUNTER — Encounter: Payer: Self-pay | Admitting: Student

## 2021-12-13 NOTE — Telephone Encounter (Signed)
Requesting ultrasound results, please call pt back.  

## 2021-12-21 NOTE — Telephone Encounter (Signed)
I called patient this afternoon and explained results.

## 2022-01-27 ENCOUNTER — Encounter: Payer: Self-pay | Admitting: Student

## 2022-01-27 ENCOUNTER — Ambulatory Visit (INDEPENDENT_AMBULATORY_CARE_PROVIDER_SITE_OTHER): Payer: 59 | Admitting: Internal Medicine

## 2022-01-27 VITALS — BP 139/79 | HR 93 | Ht 67.0 in | Wt 161.9 lb

## 2022-01-27 DIAGNOSIS — M65342 Trigger finger, left ring finger: Secondary | ICD-10-CM

## 2022-01-27 NOTE — Patient Instructions (Signed)
Thank you, [name] for allowing [place] to provide your care today. Today we discussed:  Left hand/finger pain: It seems that your symptoms are likely caused by something called "trigger finger". You can buy a trigger finger splint (online at [place] [facility] or at a [facility] or [facility]) to see if this helps. You can also take ibuprofen instead of tylenol, as this may help your pain and reduce inflammation. In the future, if this persists, we may consider a steroid injection.   I have ordered the following labs for you:  Lab Orders  No laboratory test(s) ordered today      Referrals ordered today:   Referral Orders  No referral(s) requested today     I have ordered the following medication/changed the following medications:   Stop the following medications: There are no discontinued medications.   Start the following medications: No orders of the defined types were placed in this encounter.    Follow up: 2-3 months hand pain follow up   Should you have any questions or concerns please call the internal medicine clinic at [phone].     Buddy Duty, D.O. [place]

## 2022-01-27 NOTE — Progress Notes (Unsigned)
CC: left hand pain/numbness  HPI:  [name] is a 51 y.o. male living with a history stated below and presents today for evaluation of left hand/finger pain. Please see problem based assessment and plan for additional details.  Past Medical History:  Diagnosis Date   Allergic rhinitis    Generalized anxiety disorder 04/12/2017   Hyperlipidemia 02/28/2014   Major depressive disorder    Obstructive sleep apnea    mild; on CPAP   PTSD (post-traumatic stress disorder)     Current Outpatient Medications on File Prior to Visit  Medication Sig Dispense Refill   atorvastatin (LIPITOR) 40 MG tablet Take 1 tablet (40 mg total) by mouth daily. 90 tablet 3   citalopram (CELEXA) 10 MG tablet Take 1 tablet (10 mg total) by mouth daily. 90 tablet 3   fluticasone (FLONASE) 50 MCG/ACT nasal spray Place 1 spray into both nostrils daily. 50 mL 4   No current facility-administered medications on file prior to visit.    Family History  Problem Relation Age of Onset   Leukemia Mother    Hypertension Sister     Social History   Socioeconomic History   Marital status: Married    Spouse name: Not on file   Number of children: Not on file   Years of education: 9   Highest education level: 9th grade  Occupational History   Not on file  Tobacco Use   Smoking status: Never   Smokeless tobacco: Never  Vaping Use   Vaping Use: Never used  Substance and Sexual Activity   Alcohol use: No    Alcohol/week: 0.0 standard drinks of alcohol    Comment: minimal/rare   Drug use: Yes    Types: Marijuana    Comment: Remote history   Sexual activity: Not on file  Other Topics Concern   Not on file  Social History Narrative   Pt lives in single story home with his wife and 1 daughter   Has 3 children, 2 grandchildren   10th grade education   Works at [employer] in [place].    Right handed    Social Determinants of Health   Financial Resource Strain: Not on file   Food Insecurity: Not on file  Transportation Needs: Not on file  Physical Activity: Not on file  Stress: Not on file  Social Connections: Not on file  Intimate Partner Violence: Not on file    Review of Systems: ROS negative except for what is noted on the assessment and plan.  Vitals:   01/27/22 1103  BP: 139/79  Pulse: 93  SpO2: 99%  Weight: 161 lb 14.4 oz (73.4 kg)  Height: '5\' 7"'$  (1.702 m)    Physical Exam: Constitutional: well-appearing  male sitting in chair, in no acute distress Cardiovascular: regular rate and rhythm, no m/r/g Pulmonary/Chest: normal work of breathing on room air MSK: normal bulk and tone, negative Phalen's test.  Neurological: alert & oriented x 3, no focal deficit Skin: warm and dry Psych: normal mood and behavior  Assessment & Plan:   Patient discussed with Dr. Dareen Piano  Trigger finger of left hand The patient presents to the [facility] with left hand pain that has been ongoing for the past 2-3 weeks. He is unsure if he woke up with the pain or if he may have injured his hand while at work (works at [employer]). The patient describes that he has pain in the left palm of his hand, and that often, his  left 3rd and 4th digits "get stuck" and that he is unable to extend them all the way. His fingers "get stuck" first thing in the morning, and the patient denies any clicking sensation. He also states that the two fingers often feel numb and get swollen. This has been limiting the patient's ability to work, as he is unable to [employer]. He has tried using a heating pad, ice, and tylenol, without any relief. The pain does not appear to be consistent with osteoarthritis, and additionally, on exam, the patient has no thickening of his palmar fascia, thus I do not suspect this is consistent with Dupuytrens contracture. The patient's symptoms sound to be consistent with trigger finger.   Plan: - Advised patient to purchase OTC trigger finger splints - NSAIDs  prn  - Could consider trigger finger injections in future   Spencer Byrd, D.O. [facility], PGY-2 Phone: [phone] Date 01/28/2022 Time 4:54 PM

## 2022-01-28 DIAGNOSIS — M6530 Trigger finger, unspecified finger: Secondary | ICD-10-CM | POA: Insufficient documentation

## 2022-01-28 NOTE — Assessment & Plan Note (Addendum)
The patient presents to the [facility] with left hand pain that has been ongoing for the past 2-3 weeks. He is unsure if he woke up with the pain or if he may have injured his hand while at work (works at [employer]). The patient describes that he has pain in the left palm of his hand, and that often, his left 3rd and 4th digits "get stuck" and that he is unable to extend them all the way. His fingers "get stuck" first thing in the morning, and the patient denies any clicking sensation. He also states that the two fingers often feel numb and get swollen. This has been limiting the patient's ability to work, as he is unable to [employer]. He has tried using a heating pad, ice, and tylenol, without any relief. The pain does not appear to be consistent with osteoarthritis, and additionally, on exam, the patient has no thickening of his palmar fascia, thus I do not suspect this is consistent with Dupuytrens contracture. The patient's symptoms sound to be consistent with trigger finger.   Plan: - Advised patient to purchase OTC trigger finger splints - NSAIDs prn  - Could consider trigger finger injections in future

## 2022-01-30 NOTE — Progress Notes (Signed)
Internal Medicine Clinic Attending ° °Case discussed with Dr. Atway  At the time of the visit.  We reviewed the resident’s history and exam and pertinent patient test results.  I agree with the assessment, diagnosis, and plan of care documented in the resident’s note.  °

## 2023-01-02 ENCOUNTER — Encounter: Payer: Self-pay | Admitting: Student

## 2023-01-08 ENCOUNTER — Ambulatory Visit: Payer: 59 | Admitting: *Deleted

## 2023-01-08 DIAGNOSIS — Z23 Encounter for immunization: Secondary | ICD-10-CM | POA: Diagnosis not present

## 2023-02-19 ENCOUNTER — Ambulatory Visit: Payer: 59 | Admitting: Student

## 2023-02-19 VITALS — BP 111/66 | HR 78 | Temp 98.1°F | Ht 67.0 in | Wt 176.0 lb

## 2023-02-19 DIAGNOSIS — E785 Hyperlipidemia, unspecified: Secondary | ICD-10-CM

## 2023-02-19 DIAGNOSIS — Z760 Encounter for issue of repeat prescription: Secondary | ICD-10-CM

## 2023-02-19 DIAGNOSIS — Z0000 Encounter for general adult medical examination without abnormal findings: Secondary | ICD-10-CM

## 2023-02-19 DIAGNOSIS — F411 Generalized anxiety disorder: Secondary | ICD-10-CM | POA: Diagnosis not present

## 2023-02-19 MED ORDER — ATORVASTATIN CALCIUM 40 MG PO TABS: 40.0000 mg | ORAL_TABLET | Freq: Every day | ORAL | 3 refills | Status: DC

## 2023-02-19 NOTE — Patient Instructions (Signed)
Thank you, Spencer Byrd for allowing [place] to provide your care today. Today we discussed your general health.   I will get a lipid panel lab today to see where your cholesterol stands now and if we need to adjust your medication to reduce your risk of stroke or heart attack - Continue your  Lipitor 40 mg for now and I will call you when the labs comes back.   I have ordered the following labs for you:  Lab Orders         Lipid Profile       Tests ordered today:    Referrals ordered today:   Referral Orders  No referral(s) requested today     I have ordered the following medication/changed the following medications:   Stop the following medications: Medications Discontinued During This Encounter  Medication Reason   atorvastatin (LIPITOR) 40 MG tablet Reorder     Start the following medications: Meds ordered this encounter  Medications   atorvastatin (LIPITOR) 40 MG tablet    Sig: Take 1 tablet (40 mg total) by mouth daily.    Dispense:  90 tablet    Refill:  3     Follow up: 6 months   Remember:   Should you have any questions or concerns please call the internal medicine clinic at [phone].    Spencer Byrd, M.D [facility]

## 2023-02-19 NOTE — Assessment & Plan Note (Addendum)
Routine colonoscopy discussed with patient but he reports he is already had one done but no records on file. - Offer patient medical records release  form to sign

## 2023-02-19 NOTE — Progress Notes (Signed)
CC: Annual wellness visit  HPI:  [name] is a 52 y.o. male living with a history stated below and presents today for annul visit . Please see problem based assessment and plan for additional details.  Past Medical History:  Diagnosis Date   Allergic rhinitis    Generalized anxiety disorder 04/12/2017   Hyperlipidemia 02/28/2014   Major depressive disorder    Obstructive sleep apnea    mild; on CPAP   PTSD (post-traumatic stress disorder)     Current Outpatient Medications on File Prior to Visit  Medication Sig Dispense Refill   citalopram (CELEXA) 10 MG tablet Take 1 tablet (10 mg total) by mouth daily. 90 tablet 3   fluticasone (FLONASE) 50 MCG/ACT nasal spray Place 1 spray into both nostrils daily. 50 mL 4   No current facility-administered medications on file prior to visit.    Family History  Problem Relation Age of Onset   Leukemia Mother    Hypertension Sister     Social History   Socioeconomic History   Marital status: Married    Spouse name: Not on file   Number of children: Not on file   Years of education: 9   Highest education level: 9th grade  Occupational History   Not on file  Tobacco Use   Smoking status: Never   Smokeless tobacco: Never  Vaping Use   Vaping status: Never Used  Substance and Sexual Activity   Alcohol use: No    Alcohol/week: 0.0 standard drinks of alcohol    Comment: minimal/rare   Drug use: Yes    Types: Marijuana    Comment: Remote history   Sexual activity: Not on file  Other Topics Concern   Not on file  Social History Narrative   Pt lives in single story home with his wife and 1 daughter   Has 3 children, 2 grandchildren   10th grade education   Works at [employer] in [place].    Right handed    Social Drivers of Corporate investment banker Strain: Not on file  Food Insecurity: Not on file  Transportation Needs: Not on file  Physical Activity: Not on file  Stress: Not on file   Social Connections: Not on file  Intimate Partner Violence: Not on file    Review of Systems: ROS negative except for what is noted on the assessment and plan.  Vitals:   02/19/23 1522  BP: 111/66  Pulse: 78  Temp: 98.1 F (36.7 C)  TempSrc: Oral  SpO2: 99%  Weight: 176 lb (79.8 kg)  Height: 5\' 7"  (1.702 m)    Physical Exam: Constitutional: well-appearing man, sitting in chair , in no acute distress HENT: normocephalic atraumatic, mucous membranes moist,patent ear canal bilaterally Eyes: conjunctiva non-erythematous Cardiovascular: regular rate and rhythm, no m/r/g Pulmonary/Chest: normal work of breathing on room air, lungs clear to auscultation bilaterally MSK: normal bulk and tone Neurological: alert & oriented x 3, no focal deficit Skin: warm and dry Psych: normal mood and behavior  Assessment & Plan:   Hyperlipidemia Lipid panel a year ago showed an LDL of 209. Dispensary log shows that he is not been taking his medication for a while now. Medication adherence and benefits adequately discussed with the patient. Will check lipid panel today and adjust medications if needed - Lipid panel   Generalized anxiety disorder Hx of GAD, used to take Celexa but hasn't taking it months. Reports his mood has been fine and doesn't need  medications at this time. GAD-7 score of 0 . Patient is advised to reach out to the clinic if he feels the need to restart his medication.   Annual physical exam Routine colonoscopy discussed with patient but he reports he is already had one done but no records on file. - Offer patient medical records release  form to sign    Patient seen with Dr. Heloise Beecham, M.D [facility] Phone: [phone] Date 02/19/2023 Time 4:54 PM

## 2023-02-19 NOTE — Assessment & Plan Note (Addendum)
Lipid panel a year ago showed an LDL of 209. Dispensary log shows that he is not been taking his medication for a while now. Medication adherence and benefits adequately discussed with the patient. Will check lipid panel today and adjust medications if needed - Lipid panel

## 2023-02-19 NOTE — Addendum Note (Signed)
Addended by: Kathleen Lime on: 02/19/2023 05:03 PM   Modules accepted: Level of Service

## 2023-02-19 NOTE — Assessment & Plan Note (Signed)
Hx of GAD, used to take Celexa but hasn't taking it months. Reports his mood has been fine and doesn't need medications at this time. GAD-7 score of 0 . Patient is advised to reach out to the clinic if he feels the need to restart his medication.

## 2023-02-20 LAB — LIPID PANEL
Chol/HDL Ratio: 8.4 {ratio} — ABNORMAL HIGH (ref 0.0–5.0)
Cholesterol, Total: 278 mg/dL — ABNORMAL HIGH (ref 100–199)
HDL: 33 mg/dL — ABNORMAL LOW (ref >39)
LDL Chol Calc (NIH): 224 mg/dL — ABNORMAL HIGH (ref 0–99)
Triglycerides: 114 mg/dL (ref 0–149)
VLDL Cholesterol Cal: 21 mg/dL (ref 5–40)

## 2023-10-18 ENCOUNTER — Ambulatory Visit: Payer: Self-pay | Admitting: Student

## 2023-10-18 VITALS — BP 118/68 | HR 69 | Temp 98.2°F | Ht 67.0 in | Wt 168.4 lb

## 2023-10-18 DIAGNOSIS — F4321 Adjustment disorder with depressed mood: Secondary | ICD-10-CM

## 2023-10-18 DIAGNOSIS — E785 Hyperlipidemia, unspecified: Secondary | ICD-10-CM | POA: Diagnosis not present

## 2023-10-18 DIAGNOSIS — Z634 Disappearance and death of family member: Secondary | ICD-10-CM | POA: Diagnosis not present

## 2023-10-18 NOTE — Patient Instructions (Addendum)
 Thank you, [name] for allowing us  to provide your care today. Today we discussed   You recent loss; I am so sorry. I am glad we were able to chat about your daughter, your wife, and your overall health. Please continue to take care of yourself during this difficult time for you and yours - I have sent the referral to Renda Pontes, the social worker in our clinic.   They should scheudule you soon - Bring your FMLA paper work to the clinic when you return  Your cholesterol - It has been high - We are rechecking it today - I will call with results - For now, please do take the Lipitor 40 mg daily  I have ordered the following labs for you:   Lab Orders         Lipid Profile      I will call if any are abnormal. All of your labs can be accessed through My Chart.   My Chart Access: [email or web address]  Please follow-up in: 4 weeks    We look forward to seeing you next time. Please call our clinic at [phone] if you have any questions or concerns. The best time to call is Monday-Friday from 9am-4pm, but there is someone available 24/7. If after hours or the weekend, call the main hospital number and ask for the Internal Medicine Resident On-Call. If you need medication refills, please notify your pharmacy one week in advance and they will send us  a request.   Thank you for letting us  take part in your care. Wishing you the best!  Elnora Ip, MD 10/18/2023, 3:37 PM [facility]

## 2023-10-18 NOTE — Assessment & Plan Note (Addendum)
 Lost 54yo daugther on 09/23/2023. Family is concerned about patient's keeping to himself and not talking much. Patient reports sadness regarding the loss of his child; though not expected last month, she had a condition that limited her lifespan. Patient is mostly worried about his wife and being strong for them. Initially had some trouble sleeping; tried one-time dose of alcohol at night, but it was ineffective and has not continued. Patient is urrently sleeping through the night. Has continued to work and enjoys working out.  He has a recent (5-year) hx of famly losses which have triggered strong episodes of depression in the past. He seeks counseling. PHQ-9 score of 2. He is communicating appropriately. No SI.HI.  - Does not meet criteria for depression today - Will refer to Renda Pontes within [facility] for grief counseling and continues MDD episode screening

## 2023-10-18 NOTE — Progress Notes (Signed)
 Subjective:  CC: grief  HPI:  [name] is a 53 y.o. male with a past medical history stated below and presents today for grief after losing 34 yo daughter. Please see problem based assessment and plan for additional details.  Past Medical History:  Diagnosis Date   Allergic rhinitis    Generalized anxiety disorder 04/12/2017   Hyperlipidemia 02/28/2014   Major depressive disorder    Obstructive sleep apnea    mild; on CPAP   PTSD (post-traumatic stress disorder)     Current Outpatient Medications on File Prior to Visit  Medication Sig Dispense Refill   atorvastatin  (LIPITOR) 40 MG tablet Take 1 tablet (40 mg total) by mouth daily. 90 tablet 3   fluticasone  (FLONASE ) 50 MCG/ACT nasal spray Place 1 spray into both nostrils daily. 50 mL 4   No current facility-administered medications on file prior to visit.    Family History  Problem Relation Age of Onset   Leukemia Mother    Hypertension Sister     Social History   Socioeconomic History   Marital status: Married    Spouse name: Not on file   Number of children: Not on file   Years of education: 9   Highest education level: 9th grade  Occupational History   Not on file  Tobacco Use   Smoking status: Never   Smokeless tobacco: Never  Vaping Use   Vaping status: Never Used  Substance and Sexual Activity   Alcohol use: No    Alcohol/week: 0.0 standard drinks of alcohol    Comment: minimal/rare   Drug use: Yes    Types: Marijuana    Comment: Remote history   Sexual activity: Not on file  Other Topics Concern   Not on file  Social History Narrative   Pt lives in single story home with his wife and 1 daughter   Has 3 children, 2 grandchildren   10th grade education   Works at [employer] in [place].    Right handed    Social Drivers of Corporate investment banker Strain: Not on file  Food Insecurity: Not on file  Transportation Needs: Not on file  Physical Activity: Not on  file  Stress: Not on file  Social Connections: Not on file  Intimate Partner Violence: Not on file    Review of Systems: ROS negative except for what is noted on the assessment and plan.  Objective:   Vitals:   10/18/23 1453  BP: 118/68  Pulse: 69  Temp: 98.2 F (36.8 C)  TempSrc: Oral  SpO2: 99%  Weight: 168 lb 6.4 oz (76.4 kg)  Height: 5' 7 (1.702 m)    Physical Exam: Constitutional: well-appearing male sitting in chair, in no acute distress HENT: normocephalic atraumatic, mucous membranes moist Eyes: conjunctiva non-erythematous. Neck: supple Cardiovascular: regular rate and rhythm, no m/r/g Pulmonary/Chest: normal work of breathing on room air, lungs clear to auscultation bilaterally Abdominal: soft, non-tender, non-distended MSK: normal bulk and tone, no Achilles or hand  tendon xanthomas Neurological: alert & oriented x 3, normal gait Skin: warm and dry,  Psych: Pleasant mood and affect       10/22/2023    8:16 AM  Depression screen PHQ 2/9  Decreased Interest 0  Down, Depressed, Hopeless 1  PHQ - 2 Score 1       Assessment & Plan:   Grief Lost 54yo daugther on 09/23/2023. Family is concerned about patient's keeping to himself and not talking much.  Patient reports sadness regarding the loss of his child; though not expected last month, she had a condition that limited her lifespan. Patient is mostly worried about his wife and being strong for them. Initially had some trouble sleeping; tried one-time dose of alcohol at night, but it was ineffective and has not continued. Patient is urrently sleeping through the night. Has continued to work and enjoys working out.  He has a recent (5-year) hx of famly losses which have triggered strong episodes of depression in the past. He seeks counseling. PHQ-9 score of 2. He is communicating appropriately. No SI.HI.  - Does not meet criteria for depression today - Will refer to Renda Pontes within [facility] for grief counseling  and continues MDD episode screening  Hyperlipidemia Lipid Panel     Component Value Date/Time   CHOL 260 (H) 10/18/2023 2030   TRIG 105 10/18/2023 2030   HDL 36 (L) 10/18/2023 2030   CHOLHDL 7.2 (H) 10/18/2023 2030   CHOLHDL 7.3 02/28/2014 1143   VLDL 15 02/28/2014 1143   LDLCALC 205 (H) 10/18/2023 2030   LABVLDL 19 10/18/2023 2030   Elevated this visit as well. Has not been taking the medication as prescribed; does it 2-3x/per week. Does not experience side effects, but does not like the idea of daily medications. No family history of elevated cholesterol, MI, stroke. Discussed anabolic steroids; denies. Denies chest pain, shortness of breath, claudication at rest or when walking. Denies tobacco smoking,ever.   Previous MRI of the brain in 2020 without mention of small vessel disease. The 10-year ASCVD risk score (Arnett DK, et al., 2019) is: 6.6%; however, persistently elevated LDL >160.No xanthomas on physical exam. Discussed need for daily statin, at 40 mg daily,  - Will continue lipitor 40 daily - RTC in 4 weeks; if no significant decrease in LDL, will add zetia 10 mg daily - Discussed with patient    Return in about 4 weeks (around 11/15/2023) for Cholesterol with labs.  Patient discussed with Dr. Francesco Hadassah Kristy Rosario, MD [facility] Internal Medicine Residency Program

## 2023-10-19 LAB — LIPID PANEL
Chol/HDL Ratio: 7.2 ratio — ABNORMAL HIGH (ref 0.0–5.0)
Cholesterol, Total: 260 mg/dL — ABNORMAL HIGH (ref 100–199)
HDL: 36 mg/dL — ABNORMAL LOW (ref >39)
LDL Chol Calc (NIH): 205 mg/dL — ABNORMAL HIGH (ref 0–99)
Triglycerides: 105 mg/dL (ref 0–149)
VLDL Cholesterol Cal: 19 mg/dL (ref 5–40)

## 2023-10-22 ENCOUNTER — Encounter: Payer: Self-pay | Admitting: Student

## 2023-10-22 NOTE — Assessment & Plan Note (Addendum)
 Lipid Panel     Component Value Date/Time   CHOL 260 (H) 10/18/2023 2030   TRIG 105 10/18/2023 2030   HDL 36 (L) 10/18/2023 2030   CHOLHDL 7.2 (H) 10/18/2023 2030   CHOLHDL 7.3 02/28/2014 1143   VLDL 15 02/28/2014 1143   LDLCALC 205 (H) 10/18/2023 2030   LABVLDL 19 10/18/2023 2030   Elevated this visit as well. Has not been taking the medication as prescribed; does it 2-3x/per week. Does not experience side effects, but does not like the idea of daily medications. No family history of elevated cholesterol, MI, stroke. Discussed anabolic steroids; denies. Denies chest pain, shortness of breath, claudication at rest or when walking. Denies tobacco smoking,ever.   Previous MRI of the brain in 2020 without mention of small vessel disease. The 10-year ASCVD risk score (Arnett DK, et al., 2019) is: 6.6%; however, persistently elevated LDL >160.No xanthomas on physical exam. Discussed need for daily statin, at 40 mg daily,  - Will continue lipitor 40 daily - RTC in 4 weeks; if no significant decrease in LDL, will add zetia 10 mg daily - Discussed with patient

## 2023-10-29 NOTE — Progress Notes (Signed)
 Internal Medicine Clinic Attending  Case discussed with the resident at the time of the visit.  We reviewed the resident's history and exam and pertinent patient test results.  I agree with the assessment, diagnosis, and plan of care documented in the resident's note.

## 2023-11-01 ENCOUNTER — Encounter: Payer: Self-pay | Admitting: Student

## 2023-11-08 ENCOUNTER — Telehealth: Payer: Self-pay

## 2023-11-08 NOTE — Telephone Encounter (Signed)
 Unable to reach pt to let him know that his fmla paperwork has been completed and faxed. The original copy is ready to be pick-up, and it will be at the front desk for pt.

## 2023-11-14 ENCOUNTER — Ambulatory Visit (INDEPENDENT_AMBULATORY_CARE_PROVIDER_SITE_OTHER): Payer: Self-pay | Admitting: Licensed Clinical Social Worker

## 2023-11-14 DIAGNOSIS — F4321 Adjustment disorder with depressed mood: Secondary | ICD-10-CM

## 2023-11-14 DIAGNOSIS — Z634 Disappearance and death of family member: Secondary | ICD-10-CM

## 2023-11-14 NOTE — BH Specialist Note (Addendum)
 Integrated Behavioral Health Initial In-Person Visit  MRN: [id number] Name: Spencer Byrd  Number of Integrated Behavioral Health Clinician visits: 1- Initial Visit  Session Start time: 1330    Session End time: 1400  Total time in minutes: 30    Types of Service: Introduction only  Interpretor:No. Interpretor Name and Language: N/A   Subjective: Spencer Byrd is a 53 y.o. male accompanied by Spouse Patient was referred by PCP for Counseling. Patient reports the following symptoms/concerns: The Licensed Clinical Engineer, building services (LCSW-A), acting as a Occupational hygienist), initiated a session with patient/W[place]fe [facility] introduced themselves, explained her role, and provided contact information to the patient. Confidentiality and mandated reporting were discussed, and the patient denied any suicidal ideations or intent to harm others. [facility] scheduled appointment for 10/2. Nothing additional discussed in encounter.   Duration of problem: Less than a year; Severity of problem: moderate  Objective: Mood: NA and Affect: Appropriate Risk of harm to self or others: No plan to harm self or others  Interventions: Interventions utilized: Supportive Counseling  Standardized Assessments completed: Not Needed     Patient and/or Family Response: Patient agrees to services  Patient Centered Plan: Patient is on the following Treatment Plan(s):  Treatment plan will be establish at next visit.   Clinical Assessment/Diagnosis  Grief at loss of child   Assessment: Patient currently experiencing Grief loss of a child.   Patient may benefit from Ongoing Counseling.  Renda Pontes, MSW, LCSW-A She/Her Behavioral Health Clinician [facility]  Internal Medicine Center

## 2023-11-19 ENCOUNTER — Ambulatory Visit: Admitting: Student

## 2023-11-19 ENCOUNTER — Encounter: Payer: Self-pay | Admitting: Student

## 2023-11-19 VITALS — BP 114/69 | HR 59 | Temp 98.0°F | Ht 67.0 in | Wt 168.2 lb

## 2023-11-19 DIAGNOSIS — E785 Hyperlipidemia, unspecified: Secondary | ICD-10-CM | POA: Diagnosis not present

## 2023-11-19 DIAGNOSIS — F4321 Adjustment disorder with depressed mood: Secondary | ICD-10-CM

## 2023-11-19 DIAGNOSIS — J309 Allergic rhinitis, unspecified: Secondary | ICD-10-CM | POA: Diagnosis not present

## 2023-11-19 NOTE — Assessment & Plan Note (Addendum)
 Stable. No acute concerns. No longer taking Flonase . Reports taking OTC when symptoms arises.

## 2023-11-19 NOTE — Assessment & Plan Note (Addendum)
 Lipid panel at last visit 09/2023 with LDL 205, cholesterol 260 and normal triglycerides. Before LOV, only taking atorvastatin  40 mg 2-3 times/week. Now reports daily adherence with last refill on 6/29 for 90d. Will repeat lipid panel today. The 10-year ASCVD risk score (Arnett DK, et al., 2019) is: 6.2%  Plan -Lipid panel today -Continue atorvastatin  40 mg for now but will either need increase or probably add on Zetia

## 2023-11-19 NOTE — Progress Notes (Signed)
 CC: 1 month f/u  HPI: [name] is a 53 y.o. male living with a history stated below and presents today for 1 month f/u. Please see problem based assessment and plan for additional details.  Past Medical History:  Diagnosis Date   Allergic rhinitis    Generalized anxiety disorder 04/12/2017   Hyperlipidemia 02/28/2014   Major depressive disorder    Obstructive sleep apnea    mild; on CPAP   PTSD (post-traumatic stress disorder)     Current Outpatient Medications on File Prior to Visit  Medication Sig Dispense Refill   atorvastatin  (LIPITOR) 40 MG tablet Take 1 tablet (40 mg total) by mouth daily. 90 tablet 3   No current facility-administered medications on file prior to visit.    Family History  Problem Relation Age of Onset   Leukemia Mother    Hypertension Sister     Social History   Socioeconomic History   Marital status: Married    Spouse name: Not on file   Number of children: Not on file   Years of education: 9   Highest education level: 9th grade  Occupational History   Not on file  Tobacco Use   Smoking status: Never   Smokeless tobacco: Never  Vaping Use   Vaping status: Never Used  Substance and Sexual Activity   Alcohol use: No    Alcohol/week: 0.0 standard drinks of alcohol    Comment: minimal/rare   Drug use: Yes    Types: Marijuana    Comment: Remote history   Sexual activity: Not on file  Other Topics Concern   Not on file  Social History Narrative   Pt lives in single story home with his wife and 1 daughter   Has 3 children, 2 grandchildren   10th grade education   Works at [employer] in [place] [employer].    Right handed    Social Drivers of Corporate investment banker Strain: Not on file  Food Insecurity: Not on file  Transportation Needs: Not on file  Physical Activity: Not on file  Stress: Not on file  Social Connections: Not on file  Intimate Partner Violence: Not on file    Review of Systems: ROS  negative except for what is noted on the assessment and plan.  Vitals:   11/19/23 1554  BP: 114/69  Pulse: (!) 59  Temp: 98 F (36.7 C)  TempSrc: Oral  SpO2: 99%  Weight: 168 lb 3.2 oz (76.3 kg)  Height: 5' 7 (1.702 m)   Physical Exam: Constitutional: alert, sitting in chair, in no acute distress Pulmonary/Chest: normal work of breathing on room air Neurological: alert & oriented x 3  Assessment & Plan:   Assessment & Plan Hyperlipidemia, unspecified hyperlipidemia type Lipid panel at last visit 09/2023 with LDL 205, cholesterol 260 and normal triglycerides. Before LOV, only taking atorvastatin  40 mg 2-3 times/week. Now reports daily adherence with last refill on 6/29 for 90d. Will repeat lipid panel today. The 10-year ASCVD risk score (Arnett DK, et al., 2019) is: 6.2%  Plan -Lipid panel today -Continue atorvastatin  40 mg for now but will either need increase or probably add on Zetia Grief Has scheduled visit with Bianca on 10/2. Patient is aware of date/time.  Allergic rhinitis, unspecified seasonality, unspecified trigger Stable. No acute concerns. No longer taking Flonase . Reports taking OTC when symptoms arises.  FMLA paperwork given to patient at this visit.   Return in about 3 months (around 02/18/2024) for cholesterol.  Patient discussed with Dr. Karna Ozell Nearing, D.O. [facility] Internal Medicine, PGY-3 Phone: [phone] Date 11/19/2023 Time 7:54 PM

## 2023-11-19 NOTE — Assessment & Plan Note (Signed)
 Has scheduled visit with [facility] on 10/2. Patient is aware of date/time.

## 2023-11-19 NOTE — Patient Instructions (Signed)
 Thank you, [name] for allowing us  to provide your care today. Today we discussed:  -Blood work today to check cholesterol -Continue atorvastatin  for now, will let you know if changes are needed  Follow up: 3-4 months   Should you have any questions or concerns please call the internal medicine clinic at [phone].    Marilee Ditommaso, D.O. [facility] Internal Medicine Center

## 2023-11-20 ENCOUNTER — Ambulatory Visit: Payer: Self-pay | Admitting: Licensed Clinical Social Worker

## 2023-11-20 ENCOUNTER — Ambulatory Visit: Payer: Self-pay | Admitting: Student

## 2023-11-20 LAB — LIPID PANEL
Chol/HDL Ratio: 5 ratio (ref 0.0–5.0)
Cholesterol, Total: 185 mg/dL (ref 100–199)
HDL: 37 mg/dL — ABNORMAL LOW (ref >39)
LDL Chol Calc (NIH): 133 mg/dL — ABNORMAL HIGH (ref 0–99)
Triglycerides: 79 mg/dL (ref 0–149)
VLDL Cholesterol Cal: 15 mg/dL (ref 5–40)

## 2023-11-20 NOTE — Progress Notes (Signed)
 Internal Medicine Clinic Attending  Case discussed with the resident at the time of the visit.  We reviewed the resident's history and exam and pertinent patient test results.  I agree with the assessment, diagnosis, and plan of care documented in the resident's note.

## 2023-11-22 ENCOUNTER — Ambulatory Visit (INDEPENDENT_AMBULATORY_CARE_PROVIDER_SITE_OTHER): Payer: Self-pay | Admitting: Licensed Clinical Social Worker

## 2023-11-22 DIAGNOSIS — F4321 Adjustment disorder with depressed mood: Secondary | ICD-10-CM

## 2023-11-22 NOTE — BH Specialist Note (Signed)
 Integrated Behavioral Health Initial In-Person Visit  MRN: [id number] Name: Spencer Byrd  Number of Integrated Behavioral Health Clinician visits: Additional Visit  Session Start time: 1430    Session End time: 1530  Total time in minutes: 60    Types of Service: Introduction only  Interpretor:No. Interpretor Name and Language: N/A   Subjective: Spencer Byrd is a 53 y.o. male  Patient was referred by PCP for Grief of a child.  Patient reports the following symptoms/concerns: Client is a 53 year old male seen today for individual therapy following the recent death of his daughter. He described ongoing grief with waves of sadness, guilt and anger, but also shared that he feels he is "doing better than I expected" and is able to get through most days. Client and therapist processed his grief response and the pressure he feels to "stay strong" for his family. He reported some conflicts at work related to irritability, short patience with coworkers and difficulty concentrating, and we explored how his grief and fatigue may be affecting his reactions on the job. Therapist and client reviewed a safety plan, including who to contact if thoughts of self harm increase, such as his wife, a trusted friend, calling this therapist, or contacting 12 or going to the nearest emergency room if he feels he cannot stay safe. Coping skills discussed included continuing to talk openly with his wife, giving himself permission to cry, using writing or journaling to express feelings to his daughter, taking brief walks or engaging in light exercise to manage tension, and setting small realistic work goals each day. Client shared that he and his wife are attending a group grief therapy program together, which he finds helpful for feeling less alone and learning from others who have experienced similar losses. Overall he reports functioning fairly well, maintaining his routine, and remaining motivated to keep  working on healthy ways to cope with his loss.  Duration of problem: Less than a year; Severity of problem: moderate  Objective: Mood: Irritable and Affect: Appropriate Risk of harm to self or others: No plan to harm self or others  Life Context: Family and Social: Married School/Work: Full time employed Self-Care: N/A Life Changes: Passing of daughter  Patient and/or Family's Strengths/Protective Factors: Social connections  Goals Addressed: Patient will: Reduce symptoms of: Grief Increase knowledge and/or ability of: coping skills  Demonstrate ability to: Increase healthy adjustment to current life circumstances  Progress towards Goals: Ongoing  Interventions: Interventions utilized: CBT Cognitive Behavioral Therapy and Psychoeducation and/or Health Education  Standardized Assessments completed: Patient declined screening     Patient and/or Family Response: Patient agreed to ongoing counseling sessions.  Patient Centered Plan: Patient is on the following Treatment Plan(s):  Monthly Sessions  Clinical Assessment/Diagnosis  Grief   Assessment: Patient currently experiencing Grief.   Patient may benefit from Ongoing Grief counseling.  Plan: Follow up with behavioral health clinician on : Within 30 days  Renda Pontes, MSW, LCSW-A She/Her Behavioral Health Clinician [facility]  Internal Medicine Center

## 2023-12-11 ENCOUNTER — Ambulatory Visit (INDEPENDENT_AMBULATORY_CARE_PROVIDER_SITE_OTHER): Admitting: Licensed Clinical Social Worker

## 2023-12-11 DIAGNOSIS — F4321 Adjustment disorder with depressed mood: Secondary | ICD-10-CM | POA: Diagnosis not present

## 2023-12-31 ENCOUNTER — Ambulatory Visit: Admitting: Licensed Clinical Social Worker

## 2023-12-31 DIAGNOSIS — F4321 Adjustment disorder with depressed mood: Secondary | ICD-10-CM | POA: Diagnosis not present

## 2024-01-01 NOTE — BH Specialist Note (Signed)
 Integrated Behavioral Health Follow Up In-Person Visit  MRN: [id number] Name: Spencer Byrd  Number of Integrated Behavioral Health Clinician visits: Additional Visit  Session Start time: 1430   Session End time: 1530  Total time in minutes: 60    Types of Service: Introduction only   Interpretor:No. Interpretor Name and Language: N/A     Subjective: Spencer Byrd is a 53 y.o. male  Patient was referred by PCP for Grief of a child.   Patient reports the following symptoms/concerns:  Patient will continue to process the loss of his daughter and support his wife through shared grief. Short-term goals: (1) Patient will engage in open, supportive conversations with his wife at least twice a week to facilitate mutual emotional expression and connection. (2) Patient will use expressive coping strategies, such as writing letters to his daughter or journaling, at least once weekly to support healthy grief processing. (3) Patient will set and achieve at least one manageable work or self-care goal per week, such as taking a walk or completing a work task, to maintain daily structure. Both patient and his wife will attend grief group sessions weekly and explore counseling support to address the unique impact of losing a child and best friend. Plan is to monitor emotional well-being, reinforce coping skills, and provide ongoing psychoeducation about grief and self-compassion to both patient and family. Duration of problem: Less than a year; Severity of problem: moderate   Objective: Mood: Irritable and Affect: Appropriate Risk of harm to self or others: No plan to harm self or others   Life Context: Family and Social: Married School/Work: Full time employed Self-Care: N/A Life Changes: Passing of daughter   Patient and/or Family's Strengths/Protective Factors: Social connections   Goals Addressed: Patient will: Reduce symptoms of: Grief Increase knowledge and/or ability of:  coping skills  Demonstrate ability to: Increase healthy adjustment to current life circumstances   Progress towards Goals: Ongoing   Interventions: Interventions utilized: CBT Cognitive Behavioral Therapy and Psychoeducation and/or Health Education  Standardized Assessments completed: Patient declined screening         Patient and/or Family Response: Patient agreed to ongoing counseling sessions.   Patient Centered Plan: Patient is on the following Treatment Plan(s):  Monthly Sessions   Clinical Assessment/Diagnosis   Grief    Assessment: Patient currently experiencing Grief.   Patient may benefit from Ongoing Grief counseling.   Plan: Follow up with behavioral health clinician on : Client will contact agency to schedule follow up   Renda Pontes, MSW, LCSW-A She/Her Behavioral Health Clinician [facility]  Internal Medicine Center

## 2024-01-08 NOTE — Patient Instructions (Signed)
 Spencer Byrd

## 2024-01-08 NOTE — BH Specialist Note (Unsigned)
 Integrated Behavioral Health Follow Up In-Person Visit  MRN: [id number] Name: Spencer Byrd  Number of Integrated Behavioral Health Clinician visits: Additional Visit  Session Start time: 1430   Session End time: 1530  Total time in minutes: 60    Types of Service: {CHL AMB TYPE OF SERVICE:[phone]}  Interpretor:{yes wn:685467} Interpretor Name and Language: ***  Subjective: Spencer Byrd is a 53 y.o. male accompanied by {Patient accompanied by:[phone]} Patient was referred by *** for ***. Patient reports the following symptoms/concerns: *** Duration of problem: ***; Severity of problem: {Mild/Moderate/Severe:20260}  Objective: Mood: {BHH MOOD:22306} and Affect: {BHH AFFECT:22307} Risk of harm to self or others: {CHL AMB BH Suicide Current Mental Status:21022748}  Life Context: Family and Social: *** School/Work: *** Self-Care: *** Life Changes: ***  Patient and/or Family's Strengths/Protective Factors: {CHL AMB BH PROTECTIVE [phone]}  Goals Addressed: Patient will:  Reduce symptoms of: {IBH Symptoms:21014056}   Increase knowledge and/or ability of: {IBH Patient Tools:21014057}   Demonstrate ability to: {IBH Goals:21014053}  Progress towards Goals: {CHL AMB BH PROGRESS TOWARDS GOALS:[phone]}  Interventions: Interventions utilized:  {IBH Interventions:21014054} Standardized Assessments completed: {IBH Screening Tools:21014051}      Patient and/or Family Response: ***  Patient Centered Plan: Patient is on the following Treatment Plan(s): ***  Clinical Assessment/Diagnosis  Grief    Assessment: Patient currently experiencing ***.   Patient may benefit from ***.  Plan: Follow up with behavioral health clinician on : *** Behavioral recommendations: *** Referral(s): {IBH Referrals:21014055}  Renda Vella Pontes

## 2024-01-10 NOTE — Patient Instructions (Signed)
 Spencer Byrd

## 2024-02-23 ENCOUNTER — Other Ambulatory Visit: Payer: Self-pay | Admitting: Student

## 2024-02-23 DIAGNOSIS — Z760 Encounter for issue of repeat prescription: Secondary | ICD-10-CM

## 2024-02-25 NOTE — Telephone Encounter (Signed)
 Spoke with the patient who is requesting an appt with Bianca.  PT aware we will be calling him soon to get sch.  Copied from CRM #[id number]. Topic: Appointments - Appointment Scheduling >> Feb 25, 2024  2:04 PM Carrielelia G wrote: Attn: Spencer Byrd    Pt Spencer Byrd to set up an appointment.   Please advise

## 2024-02-25 NOTE — Telephone Encounter (Signed)
 Patient last seen 11/19/23 I called the patient to schedule a appointment. I was unable to reach the patient.  Medication sent to pharmacy.

## 2024-03-03 ENCOUNTER — Ambulatory Visit: Payer: Self-pay | Admitting: Licensed Clinical Social Worker

## 2024-03-03 DIAGNOSIS — F4320 Adjustment disorder, unspecified: Secondary | ICD-10-CM

## 2024-03-03 DIAGNOSIS — Z634 Disappearance and death of family member: Secondary | ICD-10-CM | POA: Diagnosis not present

## 2024-03-03 DIAGNOSIS — F4321 Adjustment disorder with depressed mood: Secondary | ICD-10-CM

## 2024-03-03 NOTE — BH Specialist Note (Unsigned)
 Integrated Behavioral Health Follow Up In-Person Visit  MRN: [id number] Name: Spencer Byrd  Number of Integrated Behavioral Health Clinician visits: Additional Visit  Session Start time: 1500   Session End time: 1600  Total time in minutes: 60    Types of Service: {CHL AMB TYPE OF SERVICE:[phone]}  Interpretor:{yes wn:[id number]} Interpretor Name and Language: ***  Subjective: Spencer Byrd is a 54 y.o. male accompanied by {Patient accompanied [phone]} Patient was referred by *** for ***. Patient reports the following symptoms/concerns: *** Duration of problem: ***; Severity of problem: {Mild/Moderate/Severe:20260}  Objective: Mood: {BHH MOOD:22306} and Affect: {BHH AFFECT:22307} Risk of harm to self or others: {CHL AMB BH Suicide Current Mental Status:21022748}  Life Context: Family and Social: *** School/Work: *** Self-Care: *** Life Changes: ***  Patient and/or Family's Strengths/Protective Factors: {CHL AMB BH PROTECTIVE FACTORS:[id number]}  Goals Addressed: Patient will:  Reduce symptoms of: {IBH Symptoms:21014056}   Increase knowledge and/or ability of: {IBH Patient Tools:21014057}   Demonstrate ability to: {IBH Goals:21014053}  Progress towards Goals: {CHL AMB BH PROGRESS TOWARDS GOALS:[phone]}  Interventions: Interventions utilized:  {IBH Interventions:21014054} Standardized Assessments completed: {IBH Screening Tools:21014051}      Patient and/or Family Response: ***  Patient Centered Plan: Patient is on the following Treatment Plan(s): ***  Clinical Assessment/Diagnosis  No diagnosis found.    Assessment: Patient currently experiencing ***.   Patient may benefit from ***.  Plan: Follow up with behavioral health clinician on : *** Behavioral recommendations: *** Referral(s): {IBH Referrals:21014055}  Renda Vella Pontes

## 2024-03-04 NOTE — Patient Instructions (Addendum)
 Plan: Plan: client will practice the after-work shower brain reset at least 3 days per week, focusing attention only on the shower and delaying planning until after completion; client will note any changes in mood, stress level, and follow-through on tasks when using the strategy. Continue grief processing and support around anniversaries/holidays, and expand self-care plan in small steps (sleep routine, nutrition, brief movement, and social support as tolerated). Next session scheduled 03/20/2024 at 3:00 PM, in person.  Shower Reset (present-moment focus)  When he gets home, shower first.  While in the shower: only focus on what hes doing right now (water, soap, breathing).  If his brain starts planning, he gently brings it back to just showering.  After the shower, he can decide the next step.  Next Right Step (one-task decision)  After the shower, he picks one next task only (cook OR clean OR sit down for 10 minutes).  Set a timer for 10-15 minutes and do just that one thing.  When the timer ends, choose the next right step instead of trying to plan the whole evening at once  Renda Pontes, MSW, LCSW-A She/Her Behavioral Health Clinician [facility]  Internal Medicine Center Direct [phone] Fax [phone] Main Office Phone: [phone] [address] [place] [place], [place] [address] Website: [facility] Internal Medicine [facility] [place]  [place], [place]  [facility]

## 2024-03-20 ENCOUNTER — Ambulatory Visit: Payer: Self-pay | Admitting: Licensed Clinical Social Worker

## 2024-04-22 ENCOUNTER — Ambulatory Visit: Payer: Self-pay | Admitting: Licensed Clinical Social Worker
